# Patient Record
Sex: Female | Born: 1945 | Race: White | Hispanic: No | Marital: Married | Smoking: Never smoker
Health system: Southern US, Community
[De-identification: ages and names within clinical notes are randomized; demographics above are authoritative.]

## PROBLEM LIST (undated history)

## (undated) DIAGNOSIS — R112 Nausea with vomiting, unspecified: Secondary | ICD-10-CM

## (undated) DIAGNOSIS — Z9289 Personal history of other medical treatment: Secondary | ICD-10-CM

## (undated) DIAGNOSIS — Z9889 Other specified postprocedural states: Secondary | ICD-10-CM

## (undated) DIAGNOSIS — C4490 Unspecified malignant neoplasm of skin, unspecified: Secondary | ICD-10-CM

## (undated) DIAGNOSIS — M1990 Unspecified osteoarthritis, unspecified site: Secondary | ICD-10-CM

## (undated) DIAGNOSIS — E7800 Pure hypercholesterolemia, unspecified: Secondary | ICD-10-CM

## (undated) DIAGNOSIS — Z201 Contact with and (suspected) exposure to tuberculosis: Secondary | ICD-10-CM

## (undated) HISTORY — PX: JOINT REPLACEMENT: SHX530

## (undated) HISTORY — PX: TUBAL LIGATION: SHX77

## (undated) HISTORY — PX: SKIN CANCER EXCISION: SHX779

## (undated) HISTORY — PX: BUNIONECTOMY: SHX129

## (undated) HISTORY — PX: CARPAL TUNNEL RELEASE: SHX101

---

## 1998-03-02 ENCOUNTER — Other Ambulatory Visit: Admission: RE | Admit: 1998-03-02 | Discharge: 1998-03-02 | Payer: Self-pay | Admitting: Obstetrics & Gynecology

## 2000-02-23 ENCOUNTER — Other Ambulatory Visit: Admission: RE | Admit: 2000-02-23 | Discharge: 2000-02-23 | Payer: Self-pay | Admitting: Obstetrics and Gynecology

## 2000-09-04 ENCOUNTER — Ambulatory Visit (HOSPITAL_BASED_OUTPATIENT_CLINIC_OR_DEPARTMENT_OTHER): Admission: RE | Admit: 2000-09-04 | Discharge: 2000-09-04 | Payer: Self-pay | Admitting: Orthopedic Surgery

## 2001-04-09 ENCOUNTER — Other Ambulatory Visit: Admission: RE | Admit: 2001-04-09 | Discharge: 2001-04-09 | Payer: Self-pay | Admitting: Obstetrics and Gynecology

## 2001-05-29 ENCOUNTER — Encounter: Admission: RE | Admit: 2001-05-29 | Discharge: 2001-05-29 | Payer: Self-pay | Admitting: Obstetrics and Gynecology

## 2001-05-29 ENCOUNTER — Encounter: Payer: Self-pay | Admitting: Obstetrics and Gynecology

## 2002-09-04 ENCOUNTER — Other Ambulatory Visit: Admission: RE | Admit: 2002-09-04 | Discharge: 2002-09-04 | Payer: Self-pay | Admitting: Obstetrics and Gynecology

## 2003-01-18 ENCOUNTER — Encounter: Admission: RE | Admit: 2003-01-18 | Discharge: 2003-01-18 | Payer: Self-pay | Admitting: Obstetrics and Gynecology

## 2003-02-27 HISTORY — PX: BONE ANCHORED HEARING AID IMPLANT: SHX5193

## 2003-09-06 ENCOUNTER — Other Ambulatory Visit: Admission: RE | Admit: 2003-09-06 | Discharge: 2003-09-06 | Payer: Self-pay | Admitting: Obstetrics and Gynecology

## 2004-10-03 ENCOUNTER — Other Ambulatory Visit: Admission: RE | Admit: 2004-10-03 | Discharge: 2004-10-03 | Payer: Self-pay | Admitting: Obstetrics and Gynecology

## 2005-04-03 ENCOUNTER — Encounter: Admission: RE | Admit: 2005-04-03 | Discharge: 2005-04-03 | Payer: Self-pay | Admitting: Radiology

## 2005-11-07 ENCOUNTER — Other Ambulatory Visit: Admission: RE | Admit: 2005-11-07 | Discharge: 2005-11-07 | Payer: Self-pay | Admitting: Obstetrics and Gynecology

## 2010-06-23 ENCOUNTER — Other Ambulatory Visit (HOSPITAL_COMMUNITY): Payer: Self-pay

## 2011-03-05 ENCOUNTER — Encounter (HOSPITAL_COMMUNITY): Payer: Self-pay

## 2011-03-09 ENCOUNTER — Encounter (HOSPITAL_COMMUNITY): Payer: Self-pay

## 2011-03-09 ENCOUNTER — Encounter (HOSPITAL_COMMUNITY)
Admission: RE | Admit: 2011-03-09 | Discharge: 2011-03-09 | Disposition: A | Discharge status: Home / Self Care | Payer: Medicare Other | Source: Ambulatory Visit | Attending: Orthopedic Surgery | Admitting: Orthopedic Surgery

## 2011-03-09 ENCOUNTER — Other Ambulatory Visit: Payer: Self-pay

## 2011-03-09 HISTORY — DX: Unspecified osteoarthritis, unspecified site: M19.90

## 2011-03-09 HISTORY — DX: Other specified postprocedural states: Z98.890

## 2011-03-09 HISTORY — DX: Unspecified malignant neoplasm of skin, unspecified: C44.90

## 2011-03-09 HISTORY — DX: Nausea with vomiting, unspecified: R11.2

## 2011-03-09 LAB — CBC
MCH: 29.9 pg (ref 26.0–34.0)
MCHC: 33.7 g/dL (ref 30.0–36.0)
MCV: 88.7 fL (ref 78.0–100.0)
Platelets: 240 10*3/uL (ref 150–400)
RBC: 4.85 MIL/uL (ref 3.87–5.11)

## 2011-03-09 LAB — COMPREHENSIVE METABOLIC PANEL
AST: 18 U/L (ref 0–37)
CO2: 31 mEq/L (ref 19–32)
Calcium: 10.1 mg/dL (ref 8.4–10.5)
Creatinine, Ser: 0.77 mg/dL (ref 0.50–1.10)
GFR calc Af Amer: >90 mL/min (ref >90)
GFR calc non Af Amer: 85 mL/min — ABNORMAL LOW (ref >90)

## 2011-03-09 LAB — URINALYSIS, ROUTINE W REFLEX MICROSCOPIC
Glucose, UA: NEGATIVE mg/dL
Hgb urine dipstick: NEGATIVE
Ketones, ur: NEGATIVE mg/dL
Protein, ur: NEGATIVE mg/dL

## 2011-03-09 LAB — SURGICAL PCR SCREEN: MRSA, PCR: NEGATIVE

## 2011-03-09 LAB — TYPE AND SCREEN: ABO/RH(D): B NEG

## 2011-03-09 LAB — PROTIME-INR: INR: 1.03 (ref 0.00–1.49)

## 2011-03-09 LAB — ABO/RH: ABO/RH(D): B NEG

## 2011-03-09 NOTE — Pre-Procedure Instructions (Signed)
20 Lauren Montgomery  03/09/2011   Your procedure is scheduled on:  March 14, 2011  Report to [facility] at 0800 AM.  Call this number if you have problems the morning of surgery: [phone]   Remember:   Do not eat food:After Midnight.  May have clear liquids: up to 4 Hours before arrival.  Clear liquids include soda, tea, black coffee, apple or grape juice, broth.  Take these medicines the morning of surgery with A SIP OF WATER: Tylenol   Do not wear jewelry, make-up or nail polish.  Do not wear lotions, powders, or perfumes. You may wear deodorant.  Do not shave 48 hours prior to surgery.  Do not bring valuables to the hospital.  Contacts, dentures or bridgework may not be worn into surgery.  Leave suitcase in the car. After surgery it may be brought to your room.  For patients admitted to the hospital, checkout time is 11:00 AM the day of discharge.     Special Instructions: Incentive Spirometry - Practice and bring it with you on the day of surgery. and CHG Shower Use Special Wash: 1/2 bottle night before surgery and 1/2 bottle morning of surgery.   Please read over the following fact sheets that you were given: Pain Booklet, Coughing and Deep Breathing, Blood Transfusion Information, Total Joint Packet, MRSA Information and Surgical Site Infection Prevention

## 2011-03-12 NOTE — H&P (Signed)
  [facility] [address]   [address] [place], [place] [address] [phone] A Division of [facility]  Loreta Ave, M.D.     Robert A. Thurston Hole, M.D.     Lunette Stands, M.D. Eulas Post, M.D.    Buford Dresser, M.D. Estell Harpin, M.D. Ralene Cork, D.O.          Genene Churn. Barry Dienes, PA-C            Kirstin A. Shepperson, PA-C Sterling, OPA-C   RE: Lauren, Montgomery                                [id number]      DOB: 08/13/1945 PROGRESS NOTE: 03-06-11 Chief complaint: Left hip pain.  History of present illness: 66 year-old white female with a history of end stage DJD, left hip, and chronic pain.  Returns.  States that symptoms are unchanged from previous visit.  She is wanting to proceed with total hip replacement as scheduled.   Current medications: Per patient, none. Allergies: No known drug allergies. Past medical/surgical history: Tubal ligation, bunionectomy, right carpal tunnel release and skin cancer. Review of systems: Patient does admit to occasional feelings of lightheadedness.  Denies fevers, chills, chest pain, palpitations, shortness of breath, GI or GU issues.     Family history: Positive for diabetes and cancer. Social history: Does not smoke or drink.  Patient is married.    EXAMINATION: Temperature: 98.1.  Pulse: 78.  Respirations: 12.  Blood pressure: 148/83.  Height: 5?2.  Weight: 150 pounds.  Pleasant white female, alert and oriented x 3 and in no acute distress.  Head is normocephalic, a traumatic.  PERRLA, EOMI.  Neck unremarkable.  Lungs: CTA bilaterally.  No wheezes.  Heart: RRR.  S1 and S2.  No murmurs.  Abdomen: Round and non-distended.  NBS x 4.  Soft and non-tender.  Left hip she has about 5 degrees internal and external rotation with marked discomfort.  Neurovascularly intact.  Skin warm and dry.   IMPRESSION: End stage DJD, left hip, and chronic pain.  Failed conservative  treatment.    PLAN:  We will proceed with left total hip replacement as scheduled.  Surgical procedure, along with potential rehab/recovery time discussed.  We will see if we can get her most recent office notes from her primary care physician.    Loreta Ave, M.D.   Electronically verified by Loreta Ave, M.D. DFM(JMO):jjh D 03-06-11 T 03-07-11

## 2011-03-13 MED ORDER — CHLORHEXIDINE GLUCONATE 4 % EX LIQD: 60.0000 mL | Freq: Once | CUTANEOUS | Status: DC

## 2011-03-13 MED ORDER — CEFAZOLIN SODIUM 1-5 GM-% IV SOLN
1.0000 g | INTRAVENOUS | Status: AC
  Administered 2011-03-14: 1 g via INTRAVENOUS
  Filled 2011-03-13: qty 50

## 2011-03-14 ENCOUNTER — Encounter (HOSPITAL_COMMUNITY): Payer: Self-pay | Admitting: Surgery

## 2011-03-14 ENCOUNTER — Encounter (HOSPITAL_COMMUNITY): Payer: Self-pay | Admitting: Anesthesiology

## 2011-03-14 ENCOUNTER — Encounter (HOSPITAL_COMMUNITY): Payer: Self-pay

## 2011-03-14 ENCOUNTER — Encounter (HOSPITAL_COMMUNITY)
Admission: RE | Disposition: A | Discharge status: Home / Self Care | Payer: Self-pay | Source: Ambulatory Visit | Attending: Orthopedic Surgery

## 2011-03-14 ENCOUNTER — Inpatient Hospital Stay (HOSPITAL_COMMUNITY)
Admission: RE | Admit: 2011-03-14 | Discharge: 2011-03-16 | DRG: 470 | Disposition: A | Discharge status: Home / Self Care | Payer: Medicare Other | Source: Ambulatory Visit | Attending: Orthopedic Surgery | Admitting: Orthopedic Surgery

## 2011-03-14 ENCOUNTER — Ambulatory Visit (HOSPITAL_COMMUNITY): Payer: Medicare Other | Admitting: Anesthesiology

## 2011-03-14 DIAGNOSIS — Z01812 Encounter for preprocedural laboratory examination: Secondary | ICD-10-CM

## 2011-03-14 DIAGNOSIS — Z01818 Encounter for other preprocedural examination: Secondary | ICD-10-CM

## 2011-03-14 DIAGNOSIS — M1610 Unilateral primary osteoarthritis, unspecified hip: Principal | ICD-10-CM | POA: Diagnosis present

## 2011-03-14 DIAGNOSIS — Z85828 Personal history of other malignant neoplasm of skin: Secondary | ICD-10-CM

## 2011-03-14 DIAGNOSIS — M169 Osteoarthritis of hip, unspecified: Principal | ICD-10-CM | POA: Diagnosis present

## 2011-03-14 DIAGNOSIS — Z01810 Encounter for preprocedural cardiovascular examination: Secondary | ICD-10-CM

## 2011-03-14 DIAGNOSIS — Z01811 Encounter for preprocedural respiratory examination: Secondary | ICD-10-CM

## 2011-03-14 DIAGNOSIS — Z471 Aftercare following joint replacement surgery: Secondary | ICD-10-CM

## 2011-03-14 HISTORY — PX: TOTAL HIP ARTHROPLASTY: SHX124

## 2011-03-14 SURGERY — ARTHROPLASTY, HIP, TOTAL,POSTERIOR APPROACH
Anesthesia: General | Site: Hip | Laterality: Left | Wound class: Clean

## 2011-03-14 MED ORDER — BUPIVACAINE HCL 0.5 % IJ SOLN
INTRAMUSCULAR | Status: DC | PRN
  Administered 2011-03-14: 10 mL

## 2011-03-14 MED ORDER — ACETAMINOPHEN 650 MG RE SUPP: 650.0000 mg | Freq: Four times a day (QID) | RECTAL | Status: DC | PRN

## 2011-03-14 MED ORDER — WARFARIN VIDEO
Freq: Once | Status: AC
  Administered 2011-03-15: 16:00:00

## 2011-03-14 MED ORDER — ACETAMINOPHEN 10 MG/ML IV SOLN
INTRAVENOUS | Status: DC | PRN
  Administered 2011-03-14: 1000 mg via INTRAVENOUS

## 2011-03-14 MED ORDER — ONDANSETRON HCL 4 MG/2ML IJ SOLN
4.0000 mg | Freq: Four times a day (QID) | INTRAMUSCULAR | Status: DC | PRN
  Administered 2011-03-14 – 2011-03-15 (×2): 4 mg via INTRAVENOUS
  Filled 2011-03-14 (×3): qty 2

## 2011-03-14 MED ORDER — PROPOFOL 10 MG/ML IV EMUL
INTRAVENOUS | Status: DC | PRN
  Administered 2011-03-14: 20 mg via INTRAVENOUS
  Administered 2011-03-14: 30 mg via INTRAVENOUS
  Administered 2011-03-14: 100 mg via INTRAVENOUS

## 2011-03-14 MED ORDER — DOCUSATE SODIUM 100 MG PO CAPS
100.0000 mg | ORAL_CAPSULE | Freq: Two times a day (BID) | ORAL | Status: DC
  Administered 2011-03-14 – 2011-03-16 (×4): 100 mg via ORAL
  Filled 2011-03-14 (×5): qty 1

## 2011-03-14 MED ORDER — SODIUM CHLORIDE 0.9 % IJ SOLN: 9.0000 mL | INTRAMUSCULAR | Status: DC | PRN

## 2011-03-14 MED ORDER — FLEET ENEMA 7-19 GM/118ML RE ENEM: 1.0000 | ENEMA | Freq: Once | RECTAL | Status: AC | PRN

## 2011-03-14 MED ORDER — METHOCARBAMOL 500 MG PO TABS
500.0000 mg | ORAL_TABLET | Freq: Four times a day (QID) | ORAL | Status: DC | PRN
  Filled 2011-03-14: qty 1

## 2011-03-14 MED ORDER — HYDROMORPHONE HCL PF 1 MG/ML IJ SOLN
0.2500 mg | INTRAMUSCULAR | Status: DC | PRN
  Administered 2011-03-14 (×4): 0.5 mg via INTRAVENOUS

## 2011-03-14 MED ORDER — EPHEDRINE SULFATE 50 MG/ML IJ SOLN
INTRAMUSCULAR | Status: DC | PRN
  Administered 2011-03-14: 10 mg via INTRAVENOUS

## 2011-03-14 MED ORDER — FENTANYL CITRATE 0.05 MG/ML IJ SOLN
INTRAMUSCULAR | Status: DC | PRN
  Administered 2011-03-14 (×5): 50 ug via INTRAVENOUS

## 2011-03-14 MED ORDER — ONDANSETRON HCL 4 MG/2ML IJ SOLN: 4.0000 mg | Freq: Four times a day (QID) | INTRAMUSCULAR | Status: DC | PRN

## 2011-03-14 MED ORDER — MEPERIDINE HCL 25 MG/ML IJ SOLN: 6.2500 mg | INTRAMUSCULAR | Status: DC | PRN

## 2011-03-14 MED ORDER — HYDROMORPHONE HCL PF 1 MG/ML IJ SOLN
INTRAMUSCULAR | Status: AC
  Filled 2011-03-14: qty 1

## 2011-03-14 MED ORDER — METHOCARBAMOL 100 MG/ML IJ SOLN
500.0000 mg | Freq: Four times a day (QID) | INTRAVENOUS | Status: DC | PRN
  Administered 2011-03-14: 500 mg via INTRAVENOUS
  Filled 2011-03-14 (×2): qty 5

## 2011-03-14 MED ORDER — ROCURONIUM BROMIDE 100 MG/10ML IV SOLN
INTRAVENOUS | Status: DC | PRN
  Administered 2011-03-14: 50 mg via INTRAVENOUS

## 2011-03-14 MED ORDER — LACTATED RINGERS IV SOLN
INTRAVENOUS | Status: DC | PRN
  Administered 2011-03-14 (×3): via INTRAVENOUS

## 2011-03-14 MED ORDER — SENNOSIDES-DOCUSATE SODIUM 8.6-50 MG PO TABS: 1.0000 | ORAL_TABLET | Freq: Every evening | ORAL | Status: DC | PRN

## 2011-03-14 MED ORDER — NEOSTIGMINE METHYLSULFATE 1 MG/ML IJ SOLN
INTRAMUSCULAR | Status: DC | PRN
  Administered 2011-03-14: 4 mg via INTRAVENOUS

## 2011-03-14 MED ORDER — SODIUM CHLORIDE 0.9 % IR SOLN
Status: DC | PRN
  Administered 2011-03-14: 1000 mL

## 2011-03-14 MED ORDER — ENOXAPARIN SODIUM 40 MG/0.4ML ~~LOC~~ SOLN
40.0000 mg | SUBCUTANEOUS | Status: DC
  Administered 2011-03-14 – 2011-03-15 (×2): 40 mg via SUBCUTANEOUS
  Filled 2011-03-14 (×3): qty 0.4

## 2011-03-14 MED ORDER — MORPHINE SULFATE (PF) 1 MG/ML IV SOLN: INTRAVENOUS | Status: DC

## 2011-03-14 MED ORDER — ACETAMINOPHEN 10 MG/ML IV SOLN
INTRAVENOUS | Status: AC
  Filled 2011-03-14: qty 100

## 2011-03-14 MED ORDER — CEFAZOLIN SODIUM 1-5 GM-% IV SOLN
1.0000 g | Freq: Three times a day (TID) | INTRAVENOUS | Status: AC
  Administered 2011-03-14 – 2011-03-15 (×3): 1 g via INTRAVENOUS
  Filled 2011-03-14 (×3): qty 50

## 2011-03-14 MED ORDER — PROMETHAZINE HCL 25 MG/ML IJ SOLN: 6.2500 mg | INTRAMUSCULAR | Status: DC | PRN

## 2011-03-14 MED ORDER — ACETAMINOPHEN 325 MG PO TABS: 650.0000 mg | ORAL_TABLET | Freq: Four times a day (QID) | ORAL | Status: DC | PRN

## 2011-03-14 MED ORDER — DIPHENHYDRAMINE HCL 50 MG/ML IJ SOLN
12.5000 mg | Freq: Four times a day (QID) | INTRAMUSCULAR | Status: DC | PRN
  Filled 2011-03-14: qty 0.25

## 2011-03-14 MED ORDER — COUMADIN BOOK
Freq: Once | Status: AC
  Administered 2011-03-14: 19:00:00
  Filled 2011-03-14: qty 1

## 2011-03-14 MED ORDER — ONDANSETRON HCL 4 MG/2ML IJ SOLN
INTRAMUSCULAR | Status: DC | PRN
  Administered 2011-03-14: 4 mg via INTRAVENOUS

## 2011-03-14 MED ORDER — MORPHINE SULFATE (PF) 1 MG/ML IV SOLN
INTRAVENOUS | Status: DC
  Administered 2011-03-14: 4.5 mg via INTRAVENOUS
  Administered 2011-03-14: 13:00:00 via INTRAVENOUS
  Administered 2011-03-15: 7.5 mg via INTRAVENOUS

## 2011-03-14 MED ORDER — MENTHOL 3 MG MT LOZG: 1.0000 | LOZENGE | OROMUCOSAL | Status: DC | PRN

## 2011-03-14 MED ORDER — GLYCOPYRROLATE 0.2 MG/ML IJ SOLN
INTRAMUSCULAR | Status: DC | PRN
  Administered 2011-03-14: .6 mg via INTRAVENOUS

## 2011-03-14 MED ORDER — WARFARIN SODIUM 5 MG PO TABS
5.0000 mg | ORAL_TABLET | Freq: Once | ORAL | Status: AC
  Administered 2011-03-14: 5 mg via ORAL
  Filled 2011-03-14: qty 1

## 2011-03-14 MED ORDER — LACTATED RINGERS IV SOLN
INTRAVENOUS | Status: DC
  Administered 2011-03-14: 10:00:00 via INTRAVENOUS

## 2011-03-14 MED ORDER — PHENOL 1.4 % MT LIQD
1.0000 | OROMUCOSAL | Status: DC | PRN
  Filled 2011-03-14: qty 177

## 2011-03-14 MED ORDER — MIDAZOLAM HCL 5 MG/5ML IJ SOLN
INTRAMUSCULAR | Status: DC | PRN
  Administered 2011-03-14: 1 mg via INTRAVENOUS

## 2011-03-14 MED ORDER — DIPHENHYDRAMINE HCL 12.5 MG/5ML PO ELIX
12.5000 mg | ORAL_SOLUTION | Freq: Four times a day (QID) | ORAL | Status: DC | PRN
  Filled 2011-03-14: qty 5

## 2011-03-14 MED ORDER — NALOXONE HCL 0.4 MG/ML IJ SOLN
0.4000 mg | INTRAMUSCULAR | Status: DC | PRN
  Filled 2011-03-14: qty 1

## 2011-03-14 MED ORDER — ONDANSETRON HCL 4 MG PO TABS: 4.0000 mg | ORAL_TABLET | Freq: Four times a day (QID) | ORAL | Status: DC | PRN

## 2011-03-14 SURGICAL SUPPLY — 61 items
BLADE SAW SAG 73X25 THK (BLADE) ×1
BLADE SAW SGTL 73X25 THK (BLADE) ×1 IMPLANT
BOOTCOVER CLEANROOM LRG (PROTECTIVE WEAR) ×4 IMPLANT
BRUSH FEMORAL CANAL (MISCELLANEOUS) IMPLANT
CLOTH BEACON ORANGE TIMEOUT ST (SAFETY) ×2 IMPLANT
COVER BACK TABLE 24X17X13 BIG (DRAPES) IMPLANT
COVER SURGICAL LIGHT HANDLE (MISCELLANEOUS) ×2 IMPLANT
DRAPE INCISE IOBAN 66X45 STRL (DRAPES) IMPLANT
DRAPE ORTHO SPLIT 77X108 STRL (DRAPES) ×4
DRAPE SURG ORHT 6 SPLT 77X108 (DRAPES) ×2 IMPLANT
DRAPE U-SHAPE 47X51 STRL (DRAPES) ×2 IMPLANT
DRILL BIT 7/64X5 (BIT) ×2 IMPLANT
DRSG PAD ABDOMINAL 8X10 ST (GAUZE/BANDAGES/DRESSINGS) ×3 IMPLANT
DURAPREP 26ML APPLICATOR (WOUND CARE) ×2 IMPLANT
ELECT BLADE 6.5 EXT (BLADE) IMPLANT
ELECT CAUTERY BLADE 6.4 (BLADE) ×2 IMPLANT
ELECT REM PT RETURN 9FT ADLT (ELECTROSURGICAL) ×2
ELECTRODE REM PT RTRN 9FT ADLT (ELECTROSURGICAL) ×1 IMPLANT
EVACUATOR 1/8 PVC DRAIN (DRAIN) IMPLANT
FACESHIELD LNG OPTICON STERILE (SAFETY) ×4 IMPLANT
GAUZE SPONGE 4X4 12PLY STRL LF (GAUZE/BANDAGES/DRESSINGS) ×1 IMPLANT
GAUZE XEROFORM 5X9 LF (GAUZE/BANDAGES/DRESSINGS) ×2 IMPLANT
GLOVE BIOGEL PI IND STRL 8 (GLOVE) ×1 IMPLANT
GLOVE BIOGEL PI INDICATOR 8 (GLOVE) ×1
GLOVE ORTHO TXT STRL SZ7.5 (GLOVE) ×4 IMPLANT
GOWN PREVENTION PLUS XLARGE (GOWN DISPOSABLE) ×2 IMPLANT
GOWN PREVENTION PLUS XXLARGE (GOWN DISPOSABLE) ×2 IMPLANT
GOWN STRL NON-REIN LRG LVL3 (GOWN DISPOSABLE) ×2 IMPLANT
HANDPIECE INTERPULSE COAX TIP (DISPOSABLE)
KIT BASIN OR (CUSTOM PROCEDURE TRAY) ×2 IMPLANT
KIT ROOM TURNOVER OR (KITS) ×2 IMPLANT
MANIFOLD NEPTUNE II (INSTRUMENTS) ×2 IMPLANT
NDL HYPO 25GX1X1/2 BEV (NEEDLE) ×1 IMPLANT
NEEDLE HYPO 25GX1X1/2 BEV (NEEDLE) ×2 IMPLANT
NS IRRIG 1000ML POUR BTL (IV SOLUTION) ×2 IMPLANT
PACK TOTAL JOINT (CUSTOM PROCEDURE TRAY) ×2 IMPLANT
PAD ARMBOARD 7.5X6 YLW CONV (MISCELLANEOUS) ×4 IMPLANT
PASSER SUT SWANSON 36MM LOOP (INSTRUMENTS) ×2 IMPLANT
PILLOW ABDUCTION HIP (SOFTGOODS) ×2 IMPLANT
PRESSURIZER FEMORAL UNIV (MISCELLANEOUS) IMPLANT
SET HNDPC FAN SPRY TIP SCT (DISPOSABLE) IMPLANT
SPONGE GAUZE 4X4 12PLY (GAUZE/BANDAGES/DRESSINGS) ×2 IMPLANT
SPONGE LAP 4X18 X RAY DECT (DISPOSABLE) IMPLANT
STAPLER VISISTAT 35W (STAPLE) ×2 IMPLANT
SUCTION FRAZIER TIP 10 FR DISP (SUCTIONS) ×2 IMPLANT
SUT FIBERWIRE #2 38 REV NDL BL (SUTURE) ×6
SUT VIC AB 1 CTX 36 (SUTURE) ×8
SUT VIC AB 1 CTX36XBRD ANBCTR (SUTURE) ×4 IMPLANT
SUT VIC AB 2-0 SH 27 (SUTURE) ×4
SUT VIC AB 2-0 SH 27XBRD (SUTURE) ×2 IMPLANT
SUT VIC AB 3-0 SH 27 (SUTURE)
SUT VIC AB 3-0 SH 27X BRD (SUTURE) IMPLANT
SUTURE FIBERWR#2 38 REV NDL BL (SUTURE) ×3 IMPLANT
SYR 30ML SLIP (SYRINGE) ×2 IMPLANT
SYR CONTROL 10ML LL (SYRINGE) ×2 IMPLANT
TAPE CLOTH SURG 4X10 WHT LF (GAUZE/BANDAGES/DRESSINGS) ×1 IMPLANT
TOWEL OR 17X24 6PK STRL BLUE (TOWEL DISPOSABLE) ×2 IMPLANT
TOWEL OR 17X26 10 PK STRL BLUE (TOWEL DISPOSABLE) ×2 IMPLANT
TOWER CARTRIDGE SMART MIX (DISPOSABLE) IMPLANT
TRAY FOLEY CATH 14FR (SET/KITS/TRAYS/PACK) ×2 IMPLANT
WATER STERILE IRR 1000ML POUR (IV SOLUTION) ×8 IMPLANT

## 2011-03-14 NOTE — Plan of Care (Signed)
Problem: Consults Goal: Diagnosis- Total Joint Replacement hemiarthroplasty

## 2011-03-14 NOTE — Brief Op Note (Signed)
03/14/2011  1:48 PM  PATIENT:  Lauren Montgomery  66 y.o. female  PRE-OPERATIVE DIAGNOSIS:  DJD LEFT HIP  POST-OPERATIVE DIAGNOSIS:  DJD LEFT HIP  PROCEDURE:  Procedure(s): Left TOTAL HIP ARTHROPLASTY  SURGEON:  Surgeon(s): Loreta Ave, MD  PHYSICIAN ASSISTANT: Zonia Kief M   ANESTHESIA:   general  EBL:  Total I/O In: 2100 [I.V.:2100] Out: 1400 [Urine:1200; Blood:200]  BLOOD ADMINISTERED:none  SPECIMEN:  No Specimen  DISPOSITION OF SPECIMEN:  N/A  COUNTS:  YES   PLAN OF CARE: Admit to inpatient

## 2011-03-14 NOTE — Anesthesia Postprocedure Evaluation (Signed)
  Anesthesia Post-op Note  Patient: Lauren Montgomery  Procedure(s) Performed:  TOTAL HIP ARTHROPLASTY - 120 MINUTES FOR THIS SURGERY  Patient Location: PACU  Anesthesia Type: General  Level of Consciousness: awake  Airway and Oxygen Therapy: Patient Spontanous Breathing and Patient connected to nasal cannula oxygen  Post-op Pain: moderate  Post-op Assessment: Post-op Vital signs reviewed, Patient's Cardiovascular Status Stable, Respiratory Function Stable, Patent Airway and No signs of Nausea or vomiting  Post-op Vital Signs: Reviewed and stable  Complications: No apparent anesthesia complications

## 2011-03-14 NOTE — Preoperative (Signed)
Beta Blockers   Reason not to administer Beta Blockers:Not Applicable 

## 2011-03-14 NOTE — Progress Notes (Signed)
ANTICOAGULATION CONSULT NOTE - Initial Consult  Pharmacy Consult for Coumadin Indication: VTE prophylaxis  Allergies  Allergen Reactions  . Codeine Nausea Only    Patient Measurements: Height: 5\' 2"  (157.5 cm) Weight: 152 lb (68.947 kg) IBW/kg (Calculated) : 50.1    Vital Signs: Temp: 97.1 F (36.2 C) (01/16 1356) Temp src: Oral (01/16 0757) BP: 129/65 mmHg (01/16 1356) Pulse Rate: 82  (01/16 1356)  Labs: preop labs 03/09/11  PTT 28, PT 13.7, INR 1.03, H/H 14.5/43,  PLTC= 240K, WBC 8K, SCr 0.77 No results found for this basename: HGB:2,HCT:3,PLT:3,APTT:3,LABPROT:3,INR:3,HEPARINUNFRC:3,CREATININE:3,CKTOTAL:3,CKMB:3,TROPONINI:3 in the last 72 hours Estimated Creatinine Clearance: 62.1 ml/min (by C-G formula based on Cr of 0.77).  Medical History: Past Medical History  Diagnosis Date  . Skin cancer     nose  . Blood transfusion     with childbirth  . Osteoarthritis   . Cataract     left eye  . Degenerative joint disease   . PONV (postoperative nausea and vomiting)     Medications:  Prescriptions prior to admission  Medication Sig Dispense Refill  . Acetaminophen (TYLENOL PO) Take 2 tablets by mouth 2 (two) times daily as needed. For pain.       Bertram Gala Glycol-Propyl Glycol (SYSTANE OP) Place 2 drops into the left eye 2 (two) times daily as needed. For dry eyes       Scheduled:    . ceFAZolin (ANCEF) IV  1 g Intravenous 60 min Pre-Op  . ceFAZolin (ANCEF) IV  1 g Intravenous Q8H  . docusate sodium  100 mg Oral BID  . enoxaparin  40 mg Subcutaneous Q24H  . HYDROmorphone      . morphine   Intravenous Q4H  . morphine   Intravenous To PACU  . DISCONTD: chlorhexidine  60 mL Topical Once    Assessment: 66 yo female s/p left THA due to DJD left hip. Coumadin and Lovenox bridge for VTE prophylaxis.  No h/o bleeding complication reported.  Coumadin point score = 1.  Goal of Therapy:  INR 2-3   Plan:  Coumadin 5mg  po today x1.  Monitor INR daily.  Coumadin  education to be complete prior to discharge.   Arman Filter 03/14/2011,4:35 PM

## 2011-03-14 NOTE — Interval H&P Note (Signed)
History and Physical Interval Note:  03/14/2011 8:34 AM  Lauren Montgomery  has presented today for surgery, with the diagnosis of DJD LEFT HIP  The various methods of treatment have been discussed with the patient and family. After consideration of risks, benefits and other options for treatment, the patient has consented to  Procedure(s): TOTAL HIP ARTHROPLASTY as a surgical intervention .  The patients' history has been reviewed, patient examined, no change in status, stable for surgery.  I have reviewed the patients' chart and labs.  Questions were answered to the patient's satisfaction.     MURPHY,DANIEL F

## 2011-03-14 NOTE — Anesthesia Preprocedure Evaluation (Addendum)
Anesthesia Evaluation  Patient identified by MRN, date of birth, ID band Patient awake    Reviewed: Allergy & Precautions, H&P , NPO status , Patient's Chart, lab work & pertinent test results  History of Anesthesia Complications (+) PONV  Airway Mallampati: III TM Distance: >3 FB Neck ROM: Full    Dental No notable dental hx. (+) Teeth Intact   Pulmonary neg pulmonary ROS,  clear to auscultation  Pulmonary exam normal       Cardiovascular neg cardio ROS Regular Normal    Neuro/Psych Negative Neurological ROS  Negative Psych ROS   GI/Hepatic negative GI ROS, Neg liver ROS,   Endo/Other  Negative Endocrine ROS  Renal/GU negative Renal ROS  Genitourinary negative   Musculoskeletal   Abdominal   Peds  Hematology negative hematology ROS (+)   Anesthesia Other Findings   Reproductive/Obstetrics negative OB ROS                           Anesthesia Physical Anesthesia Plan  ASA: II  Anesthesia Plan: General   Post-op Pain Management:    Induction: Intravenous  Airway Management Planned: Oral ETT  Additional Equipment:   Intra-op Plan:   Post-operative Plan: Extubation in OR  Informed Consent: I have reviewed the patients History and Physical, chart, labs and discussed the procedure including the risks, benefits and alternatives for the proposed anesthesia with the patient or authorized representative who has indicated his/her understanding and acceptance.     Plan Discussed with: CRNA  Anesthesia Plan Comments:         Anesthesia Quick Evaluation

## 2011-03-14 NOTE — Transfer of Care (Signed)
Immediate Anesthesia Transfer of Care Note  Patient: Lauren Montgomery  Procedure(s) Performed:  TOTAL HIP ARTHROPLASTY - 120 MINUTES FOR THIS SURGERY  Patient Location: PACU  Anesthesia Type: General  Level of Consciousness: awake, alert , oriented and sedated  Airway & Oxygen Therapy: Patient Spontanous Breathing and Patient connected to nasal cannula oxygen  Post-op Assessment: Report given to PACU RN, Post -op Vital signs reviewed and stable and Patient moving all extremities  Post vital signs: Reviewed and stable Filed Vitals:   03/14/11 0757  BP: 143/94  Pulse: 63  Temp: 36.6 C  Resp: 20    Complications: No apparent anesthesia complications

## 2011-03-15 ENCOUNTER — Encounter (HOSPITAL_COMMUNITY): Payer: Self-pay | Admitting: Orthopedic Surgery

## 2011-03-15 LAB — CBC
Hemoglobin: 11.8 g/dL — ABNORMAL LOW (ref 12.0–15.0)
MCV: 88.6 fL (ref 78.0–100.0)
Platelets: 187 10*3/uL (ref 150–400)
RBC: 4.04 MIL/uL (ref 3.87–5.11)
WBC: 10 10*3/uL (ref 4.0–10.5)

## 2011-03-15 LAB — BASIC METABOLIC PANEL
BUN: 11 mg/dL (ref 6–23)
CO2: 27 mEq/L (ref 19–32)
Calcium: 8.9 mg/dL (ref 8.4–10.5)
Chloride: 103 mEq/L (ref 96–112)
Creatinine, Ser: 0.78 mg/dL (ref 0.50–1.10)
GFR calc Af Amer: >90 mL/min (ref >90)
GFR calc non Af Amer: 85 mL/min — ABNORMAL LOW (ref >90)
Glucose, Bld: 137 mg/dL — ABNORMAL HIGH (ref 70–99)
Potassium: 4 mEq/L (ref 3.5–5.1)
Sodium: 140 mEq/L (ref 135–145)

## 2011-03-15 MED ORDER — OXYCODONE-ACETAMINOPHEN 5-325 MG PO TABS
1.0000 | ORAL_TABLET | ORAL | Status: DC | PRN
  Administered 2011-03-15 – 2011-03-16 (×5): 1 via ORAL
  Filled 2011-03-15 (×5): qty 1

## 2011-03-15 MED ORDER — WARFARIN SODIUM 5 MG PO TABS
5.0000 mg | ORAL_TABLET | Freq: Once | ORAL | Status: AC
  Administered 2011-03-15: 5 mg via ORAL
  Filled 2011-03-15: qty 1

## 2011-03-15 NOTE — Progress Notes (Signed)
UR COMPLETED  

## 2011-03-15 NOTE — Progress Notes (Signed)
Physical Therapy Evaluation Patient Details Name: Lauren Montgomery MRN: [id number] DOB: 03/20/1945 Today's Date: 03/15/2011  Problem List: There is no problem list on file for this patient.   Past Medical History:  Past Medical History  Diagnosis Date  . Skin cancer     nose  . Blood transfusion     with childbirth  . Osteoarthritis   . Cataract     left eye  . Degenerative joint disease   . PONV (postoperative nausea and vomiting)    Past Surgical History:  Past Surgical History  Procedure Date  . Bone anchored hearing aid implant   . Carpal tunnel release     right hand  . Tubal ligation   . Bunionectomy   . Total hip arthroplasty 03/14/2011    Procedure: TOTAL HIP ARTHROPLASTY;  Surgeon: Loreta Ave, MD;  Location: [facility] OR;  Service: Orthopedics;  Laterality: Left;  120 MINUTES FOR THIS SURGERY    PT Assessment/Plan/Recommendation PT Assessment PT Recommendation/Assessment: Patient will need skilled PT in the acute care venue PT Problem List: Decreased strength;Decreased range of motion;Decreased activity tolerance;Decreased balance;Decreased mobility;Pain Barriers to Discharge: None PT Therapy Diagnosis : Difficulty walking;Abnormality of gait;Generalized weakness;Acute pain PT Plan PT Frequency: 7X/week PT Treatment/Interventions: Gait training;Stair training;Functional mobility training;Therapeutic activities;Therapeutic exercise PT Recommendation Follow Up Recommendations: Home health PT Equipment Recommended:  (has RW and BSC) PT Goals  Acute Rehab PT Goals PT Goal Formulation: With patient Time For Goal Achievement: 7 days Pt will go Supine/Side to Sit: with supervision Pt will go Sit to Stand: with supervision Pt will Ambulate: 51 - 150 feet;with supervision;with rolling walker Pt will Perform Home Exercise Program: Independently  PT Evaluation Precautions/Restrictions  Precautions Precautions: Posterior Hip Precaution Booklet Issued: Yes  (comment) Restrictions Weight Bearing Restrictions: Yes LLE Weight Bearing: Touchdown weight bearing Prior Functioning  Home Living Lives With: Spouse Type of Home: House Home Layout: One level Home Access: Stairs to enter Entrance Stairs-Rails: Right Entrance Stairs-Number of Steps: 3 Bathroom Shower/Tub: Tub/shower unit;Walk-in shower Bathroom Toilet: Standard Bathroom Accessibility: Yes How Accessible: Accessible via walker Home Adaptive Equipment: Walker - rolling;Bedside commode/3-in-1;Straight cane;Reacher Prior Function Level of Independence: Independent with basic ADLs;Independent with gait;Independent with transfers Able to Take Stairs?: Yes Driving: Yes Vocation: Retired Financial risk analyst Arousal/Alertness: Awake/alert Orientation Level: Oriented X4 Sensation/Coordination Sensation Light Touch: Appears Intact Pain: 7/10 Extremity Assessment RUE Assessment RUE Assessment: Within Functional Limits LUE Assessment LUE Assessment: Within Functional Limits RLE Assessment RLE Assessment: Within Functional Limits LLE Assessment LLE Assessment: Not tested (due to pt s/p L THA) Mobility (including Balance) Bed Mobility Bed Mobility: Yes Supine to Sit: 2: Max assist Transfers Transfers: Yes Sit to Stand: 2: Max assist;From bed Stand Pivot Transfers: 2: Max assist Ambulation/Gait Ambulation/Gait: Yes Ambulation/Gait Assistance: 2: Max assist Assistive device: Rolling walker Gait Pattern:  (pivoted on R LE) Gait velocity: increased time required Stairs: No  Balance Balance Assessed: Yes Static Sitting Balance Static Sitting - Balance Support: Feet supported Static Sitting - Level of Assistance: 4: Min Oncologist Standing - Balance Support: Bilateral upper extremity supported Static Standing - Level of Assistance: 3: Mod assist Exercise  Total Joint Exercises Ankle Circles/Pumps: AROM;10 reps;Both;Supine Quad Sets: AROM;Left;10  reps;Supine Gluteal Sets: AROM;10 reps;Supine;Left Hip ABduction/ADduction: AAROM;Left;10 reps;Supine End of Session PT - End of Session Equipment Utilized During Treatment: Gait belt Activity Tolerance: Patient limited by fatigue Patient left: in chair;with call bell in reach Nurse Communication: Mobility status for transfers General Behavior During Session:  WFL for tasks performed Cognition: [facility] for tasks performed  [name] 03/15/2011, 1:19 PM  [name], PT, DPT Pager #: [phone] Office #: [phone]

## 2011-03-15 NOTE — Progress Notes (Signed)
Physical Therapy Treatment Patient Details Name: Lauren Montgomery MRN: [id number] DOB: 1945/09/08 Today's Date: 03/15/2011  PT Assessment/Plan  PT - Assessment/Plan Comments on Treatment Session: Patient with significant increase in ther ex and ambulation tolerance this date. Patient 100% compliant with L LE TTWB. PT Plan: Discharge plan remains appropriate PT Frequency: 7X/week Follow Up Recommendations: Home health PT Equipment Recommended: None recommended by PT PT Goals  Acute Rehab PT Goals PT Goal Formulation: With patient Time For Goal Achievement: 7 days Pt will go Supine/Side to Sit: with supervision PT Goal: Supine/Side to Sit - Progress: Progressing toward goal Pt will go Sit to Stand: with supervision PT Goal: Sit to Stand - Progress: Progressing toward goal Pt will Ambulate: 51 - 150 feet;with supervision;with rolling walker PT Goal: Ambulate - Progress: Progressing toward goal Pt will Perform Home Exercise Program: Independently PT Goal: Perform Home Exercise Program - Progress: Progressing toward goal  PT Treatment Precautions/Restrictions  Precautions Precautions: Posterior Hip Precaution Booklet Issued: Yes (comment) Restrictions Weight Bearing Restrictions: Yes LLE Weight Bearing: Touchdown weight bearing Mobility (including Balance) Bed Mobility Bed Mobility: Yes Supine to Sit: 3: Mod assist Supine to Sit Details (indicate cue type and reason): assist for L LE managment Transfers Transfers: Yes Sit to Stand: 3: Mod assist Sit to Stand Details (indicate cue type and reason): verbal cues for hand placement Stand Pivot Transfers: 2: Max assist Ambulation/Gait Ambulation/Gait: Yes Ambulation/Gait Assistance: 4: Min assist Ambulation/Gait Assistance Details (indicate cue type and reason): patient 100% compliant with L LE TWB Ambulation Distance (Feet): 50 Feet Assistive device: Rolling walker Gait Pattern:  (hopped on R LE) Gait velocity: decreased  cadence Stairs: No  Balance Balance Assessed: Yes Static Sitting Balance Static Sitting - Balance Support: Feet supported Static Sitting - Level of Assistance: 4: Min assist Static Standing Balance Static Standing - Balance Support: Bilateral upper extremity supported Static Standing - Level of Assistance: 3: Mod assist Exercise  Total Joint Exercises Ankle Circles/Pumps: AROM;Both;10 reps;Supine Quad Sets: AROM;Left;10 reps;Supine Gluteal Sets: AROM;Both;10 reps;Supine Hip ABduction/ADduction: AAROM;Left;10 reps;Supine End of Session PT - End of Session Equipment Utilized During Treatment: Gait belt Activity Tolerance: Patient tolerated treatment well Patient left: in chair;with call bell in reach Nurse Communication: Mobility status for transfers General Behavior During Session: [facility] for tasks performed Cognition: [facility] for tasks performed  [name] 03/15/2011, 3:21 PM  [name], PT, DPT Pager #: [phone] Office #: [phone]

## 2011-03-15 NOTE — Progress Notes (Signed)
Subjective: Doing well   Pain controlled  Objective: Vital signs in last 24 hours: Temp:  [96.8 F (36 C)-98.9 F (37.2 C)] 98.9 F (37.2 C) (01/17 0635) Pulse Rate:  [80-97] 93  (01/17 0635) Resp:  [13-19] 18  (01/17 0635) BP: (117-139)/(64-83) 124/71 mmHg (01/17 0635) SpO2:  [94 %-100 %] 100 % (01/17 0635) Weight:  [68.947 kg (152 lb)] 68.947 kg (152 lb) (01/16 1356)  Intake/Output from previous day: 01/16 0701 - 01/17 0700 In: 3075 [P.O.:360; I.V.:2715] Out: 2600 [Urine:2400; Blood:200] Intake/Output this shift:    No results found for this basename: HGB:5 in the last 72 hours No results found for this basename: WBC:2,RBC:2,HCT:2,PLT:2 in the last 72 hours No results found for this basename: NA:2,K:2,CL:2,CO2:2,BUN:2,CREATININE:2,GLUCOSE:2,CALCIUM:2 in the last 72 hours  Basename 03/15/11 0610  LABPT --  INR 1.13    Neurovascular intact Dressing c/d/i.  Calf nontender. Assessment/Plan: D/c pca.  PT consult.  Anticipate d/c home fri or sat.     Taevin Mcferran M 03/15/2011, 9:50 AM

## 2011-03-15 NOTE — Op Note (Signed)
Lauren Montgomery, Lauren Montgomery NO.:  [id number]  MEDICAL RECORD NO.:  [id number]  LOCATION:  5013                         FACILITY:  [facility]  PHYSICIAN:  Loreta Ave, M.D. DATE OF BIRTH:  11/13/1945  DATE OF PROCEDURE:  03/14/2011 DATE OF DISCHARGE:                              OPERATIVE REPORT   PREOPERATIVE DIAGNOSIS:  End-stage degenerative arthritis, left hip. Marked erosion and spur formation.  POSTOPERATIVE DIAGNOSIS:  End-stage degenerative arthritis, left hip. Marked erosion and spur formation.  PROCEDURE:  Left total hip replacement utilizing a Stryker secure fit prosthesis.  Press-fit 46-mm acetabular component.  Screw fixation x2. A 32-mm internal diameter polyethylene liner 10-degree overhang placed posterosuperior.  Press-fit #5 secure Secur-Fit stem.  132-degree neck angle.  A 32-mm +2.5 Biolox head.  SURGEON:  Loreta Ave, MD  ASSISTANT:  Genene Churn. Barry Dienes, [place], present throughout the entire case and necessary for timely completion of procedure.  ANESTHESIA:  General.  BLOOD LOSS:  200 mL.  BLOOD GIVEN:  None.  SPECIMENS:  None.  CULTURES:  None.  COMPLICATIONS:  None.  DRESSINGS:  Soft compressive abduction pillow.  PROCEDURE:  The patient was brought to the operating room and placed on the operating table in supine position.  After adequate anesthesia had been obtained, turned to a lateral position, left side up, appropriate padding and support.  Prepped and draped in usual sterile fashion. Incision along the shaft of the femur extending posterosuperior.  Skin and subcutaneous tissue divided.  Hemostasis with cautery.  Iliotibial band exposed, incised, Charnley retractor put in place.  Neurovascular structures identified, protected, and retracted.  External rotator capsule taken down off the back of the intertrochanteric groove, tagged with FiberWire.  Hip dislocated posteriorly.  Grade 4 changes.  Femoral neck cut 1 fingerbreadth  above the lesser trochanter.  Acetabulum exposed.  Surrounding spurs removed.  Brought up to good bleeding bone, sufficient inferior medial placement.  Fitted with a 46-mm cup, hammered in place at 45 degrees of abduction, 20 degrees of anteversion. Excellent capturing and fixation, augmented with 2 screws.  Predrilled, placed through the cup.  The 32- mm internal diameter polyethylene liner inserted, 10-degree overhang posterosuperior.  Attention turned to the femur.  Proximal distal reaming brought up to good sizing fitting for a #5 component.  After appropriate trials, a #5 components seated.  The 32- mm +2.5 Biolox head attached.  Hip reduced.  Excellent leg lengths; good stability, flexion, and extension.  Wound irrigated.  External rotator capsule repaired to the back of the intertrochanteric groove through drill holes, FiberWire tied over bony bridge.  Wound irrigated. Iliotibial band closed with #1 Vicryl, skin and subcutaneous tissue with Vicryl staples.  Sterile compressive dressing applied.  Returned to supine position.  Abduction pillow placed.  Anesthesia reversed. Brought to recovery room.  Tolerated surgery well.  No complications.     Loreta Ave, M.D.     DFM/MEDQ  D:  03/14/2011  T:  03/15/2011  Job:  [id number]

## 2011-03-15 NOTE — Progress Notes (Signed)
Occupational Therapy Evaluation Patient Details Name: Lauren Montgomery MRN: [id number] DOB: 16-May-1945 Today's Date: 03/15/2011  Problem List: There is no problem list on file for this patient.   Past Medical History:  Past Medical History  Diagnosis Date  . Skin cancer     nose  . Blood transfusion     with childbirth  . Osteoarthritis   . Cataract     left eye  . Degenerative joint disease   . PONV (postoperative nausea and vomiting)    Past Surgical History:  Past Surgical History  Procedure Date  . Bone anchored hearing aid implant   . Carpal tunnel release     right hand  . Tubal ligation   . Bunionectomy   . Total hip arthroplasty 03/14/2011    Procedure: TOTAL HIP ARTHROPLASTY;  Surgeon: Loreta Ave, MD;  Location: [facility] OR;  Service: Orthopedics;  Laterality: Left;  120 MINUTES FOR THIS SURGERY    OT Assessment/Plan/Recommendation OT Assessment Clinical Impression Statement: Pt. presents s/p Left TOTAL HIP ARTHROPLASTY with increased pain. Pt. will benefit from acute OT to increase functional independence with ADLs to supervision level at D/C OT Recommendation/Assessment: Patient will need skilled OT in the acute care venue OT Problem List: Decreased strength;Decreased activity tolerance;Impaired balance (sitting and/or standing);Decreased safety awareness;Decreased knowledge of use of DME or AE;Decreased knowledge of precautions;Pain Barriers to Discharge: None OT Therapy Diagnosis : Acute pain OT Plan OT Frequency: Min 2X/week OT Treatment/Interventions: Self-care/ADL training;DME and/or AE instruction;Energy conservation;Therapeutic activities;Patient/family education;Balance training OT Recommendation Follow Up Recommendations: Home health OT;Supervision - Intermittent Equipment Recommended: None recommended by OT Individuals Consulted Consulted and Agree with Results and Recommendations: Patient OT Goals Acute Rehab OT Goals OT Goal Formulation: With  patient Time For Goal Achievement: 7 days ADL Goals Pt Will Perform Grooming: with set-up;with supervision;Standing at sink ADL Goal: Grooming - Progress: Goal set today Pt Will Perform Lower Body Bathing: with set-up;with supervision;Sit to stand from bed ADL Goal: Lower Body Bathing - Progress: Goal set today Pt Will Perform Lower Body Dressing: with supervision;with set-up;Sit to stand from bed;with adaptive equipment ADL Goal: Lower Body Dressing - Progress: Goal set today Pt Will Transfer to Toilet: with supervision;Ambulation;3-in-1;with DME ADL Goal: Toilet Transfer - Progress: Goal set today Pt Will Perform Toileting - Hygiene: with set-up;Sit to stand from 3-in-1/toilet ADL Goal: Toileting - Hygiene - Progress: Goal set today  OT Evaluation Precautions/Restrictions  Precautions Precautions: Posterior Hip Restrictions Weight Bearing Restrictions: Yes LLE Weight Bearing: Touchdown weight bearing Prior Functioning Home Living Lives With: Spouse Type of Home: House Home Layout: One level Home Access: Stairs to enter Entrance Stairs-Rails: Right Entrance Stairs-Number of Steps: 3 Bathroom Shower/Tub: Tub/shower unit;Walk-in shower Bathroom Toilet: Standard Bathroom Accessibility: Yes How Accessible: Accessible via walker Home Adaptive Equipment: Walker - rolling;Bedside commode/3-in-1;Straight cane;Reacher Prior Function Level of Independence: Independent with basic ADLs;Independent with gait;Independent with transfers Able to Take Stairs?: Yes Driving: Yes Vocation: Retired ADL ADL Eating/Feeding: Performed;Independent Where Assessed - Eating/Feeding: Chair Grooming: Performed;Wash/dry face;Set up Where Assessed - Grooming: Sitting, bed Upper Body Bathing: Simulated;Chest;Left arm;Right arm;Abdomen;Set up Where Assessed - Upper Body Bathing: Sitting, bed Lower Body Bathing: Simulated;Maximal assistance Where Assessed - Lower Body Bathing: Sit to stand from  bed Upper Body Dressing Details (indicate cue type and reason): with donning gown Where Assessed - Upper Body Dressing: Sitting, chair Lower Body Dressing: Simulated;Maximal assistance Where Assessed - Lower Body Dressing: Sit to stand from chair Toilet Transfer: Simulated;Moderate assistance Toilet Transfer Method: Stand  pivot Acupuncturist: Other (comment) Nurse, children's) Toileting - Clothing Manipulation: Simulated;Minimal assistance Toileting - Clothing Manipulation Details (indicate cue type and reason): with moving gown Where Assessed - Glass blower/designer Manipulation: Standing Toileting - Hygiene: Simulated;Minimal assistance Where Assessed - Toileting Hygiene: Standing Tub/Shower Transfer: Not assessed Equipment Used: Rolling walker ADL Comments: Pt. educated on LB ADL techniques to increase safety with maintaining hip precautions. Extremity Assessment RUE Assessment RUE Assessment: Within Functional Limits LUE Assessment LUE Assessment: Within Functional Limits Mobility  Bed Mobility Bed Mobility: Yes Supine to Sit: 1: +2 Total assist;Patient percentage (comment);HOB flat;With rails (pt=40%) Transfers Transfers: Yes Sit to Stand: 1: +2 Total assist;Patient percentage (comment);From elevated surface;From bed (pt=80%)    End of Session OT - End of Session Equipment Utilized During Treatment: Gait belt Activity Tolerance: Patient tolerated treatment well Patient left: in bed Nurse Communication: Mobility status for transfers General Behavior During Session: [facility] for tasks performed Cognition: [facility] for tasks performed   Nayson Traweek, OTR/L Pager [phone] 03/15/2011, 1:02 PM

## 2011-03-15 NOTE — Progress Notes (Addendum)
ANTICOAGULATION CONSULT NOTE - Follow Up Consult  Pharmacy Consult for coumadin  Indication: VTE prophylaxis  Allergies  Allergen Reactions  . Codeine Nausea Only    Patient Measurements: Height: 5\' 2"  (157.5 cm) Weight: 152 lb (68.947 kg) IBW/kg (Calculated) : 50.1   Vital Signs: Temp: 98.9 F (37.2 C) (01/17 0635) BP: 124/71 mmHg (01/17 0635) Pulse Rate: 93  (01/17 0635)  Labs: Baseline 03/09/11 INR 1.03 H/H 14.5/43 Platelets 240k  SCr 0.77  Basename 03/15/11 0610  HGB --  HCT --  PLT --  APTT --  LABPROT 14.7  INR 1.13  HEPARINUNFRC --  CREATININE --  CKTOTAL --  CKMB --  TROPONINI --   Estimated Creatinine Clearance: 62.1 ml/min (by C-G formula based on Cr of 0.77).   Medications:  Scheduled:    .  ceFAZolin (ANCEF) IV  1 g Intravenous Q8H  . coumadin book   Does not apply Once  . docusate sodium  100 mg Oral BID  . enoxaparin  40 mg Subcutaneous Q24H  . HYDROmorphone      . warfarin  5 mg Oral ONCE-1800  . warfarin   Does not apply Once  . DISCONTD: chlorhexidine  60 mL Topical Once  . DISCONTD: morphine   Intravenous Q4H  . DISCONTD: morphine   Intravenous To PACU    Assessment: 66 yr old F post-op day #1 s/p L THA.  No bleeding noted per ortho note.  Coumadin for DVT prophylaxis.  Patient also on enoxaparin 40 mg subcutaneously q24hr until INR>=1.8 per MD.  INR today subtherapeutic at 1.13 after first dose of coumadin.  Baseline H/H, INR, and Scr wnl.     Goal of Therapy:  INR 2-3   Plan:  1.  Coumadin 5 mg po x1 today 2.  F/u daily INR in am 3.  Enoxaparin until INR >=1.8 per MD  4.  Patient was provided coumadin education today, pt stated understanding, and patient's questions/concerns were answered  Darreld Mclean 03/15/2011,12:04 PM

## 2011-03-16 ENCOUNTER — Inpatient Hospital Stay (HOSPITAL_COMMUNITY): Payer: Medicare Other

## 2011-03-16 LAB — CBC
Hemoglobin: 10 g/dL — ABNORMAL LOW (ref 12.0–15.0)
RBC: 3.47 MIL/uL — ABNORMAL LOW (ref 3.87–5.11)

## 2011-03-16 LAB — BASIC METABOLIC PANEL
CO2: 28 mEq/L (ref 19–32)
Glucose, Bld: 117 mg/dL — ABNORMAL HIGH (ref 70–99)
Potassium: 3.7 mEq/L (ref 3.5–5.1)
Sodium: 141 mEq/L (ref 135–145)

## 2011-03-16 LAB — PROTIME-INR
INR: 2.53 — ABNORMAL HIGH (ref 0.00–1.49)
Prothrombin Time: 27.7 seconds — ABNORMAL HIGH (ref 11.6–15.2)

## 2011-03-16 NOTE — Progress Notes (Signed)
ANTICOAGULATION CONSULT NOTE - Follow Up Consult  Pharmacy Consult for coumadin  Indication: VTE prophylaxis  Allergies  Allergen Reactions  . Codeine Nausea Only    Patient Measurements: Height: 5\' 2"  (157.5 cm) Weight: 152 lb (68.947 kg) IBW/kg (Calculated) : 50.1   Vital Signs: Temp: 98.5 F (36.9 C) (01/18 0635) BP: 128/63 mmHg (01/18 0635) Pulse Rate: 100  (01/18 0635)  Labs: Baseline 03/09/11 INR 1.03 H/H 14.5/43 Platelets 240k  SCr 0.77  Basename 03/16/11 0505 03/15/11 1200 03/15/11 0610  HGB 10.0* 11.8* --  HCT 30.9* 35.8* --  PLT 170 187 --  APTT -- -- --  LABPROT 27.7* -- 14.7  INR 2.53* -- 1.13  HEPARINUNFRC -- -- --  CREATININE 0.75 0.78 --  CKTOTAL -- -- --  CKMB -- -- --  TROPONINI -- -- --   Estimated Creatinine Clearance: 62.1 ml/min (by C-G formula based on Cr of 0.75).   Medications:  Scheduled:     . docusate sodium  100 mg Oral BID  . warfarin  5 mg Oral ONCE-1800  . warfarin   Does not apply Once  . DISCONTD: enoxaparin  40 mg Subcutaneous Q24H    Assessment: 66 yr old F post-op day #2 s/p L THA.  No bleeding noted per ortho note 03/15/11.  Coumadin for DVT prophylaxis.  Patient also on enoxaparin 40 mg subcutaneously q24hr until INR>=1.8 per MD.  INR today is 2.53 after two doses of coumadin.  Patient appears to be sensitive to coumadin.  Baseline H/H, INR, and Scr wnl.  H/H and platelets decreased from baseline, likely due to surgery.     Goal of Therapy:  INR 2-3   Plan:  1.  No coumadin today due to rapid increase in INR  2.  F/u daily INR in am 3.  D/c Enoxaparin as INR >=1.8 per MD orders   Helayne Metsker, Maryagnes Amos 03/16/2011,12:13 PM

## 2011-03-16 NOTE — Progress Notes (Signed)
Subjective: Doing well.  Progressing great with therapy.  Objective: Vital signs in last 24 hours: Temp:  [98.5 F (36.9 C)-99.8 F (37.7 C)] 98.5 F (36.9 C) (01/18 0635) Pulse Rate:  [92-100] 100  (01/18 0635) Resp:  [16-18] 18  (01/18 0635) BP: (109-128)/(61-67) 128/63 mmHg (01/18 0635) SpO2:  [95 %-100 %] 97 % (01/18 0635)  Intake/Output from previous day: 01/17 0701 - 01/18 0700 In: 1980 [P.O.:1380; I.V.:600] Out: 3300 [Urine:3300] Intake/Output this shift:     Basename 03/16/11 0505 03/15/11 1200  HGB 10.0* 11.8*    Basename 03/16/11 0505 03/15/11 1200  WBC 8.6 10.0  RBC 3.47* 4.04  HCT 30.9* 35.8*  PLT 170 187    Basename 03/16/11 0505 03/15/11 1200  NA 141 140  K 3.7 4.0  CL 106 103  CO2 28 27  BUN 10 11  CREATININE 0.75 0.78  GLUCOSE 117* 137*  CALCIUM 8.5 8.9    Basename 03/16/11 0505 03/15/11 0610  LABPT -- --  INR 2.53* 1.13    Neurovascular intact Wound looks good.  Staples intact.  No drainage or signs of infection.  Calf nontender.  Assessment/Plan: Anticipate d/c home today if continues to make good progress.   Lauren Montgomery M 03/16/2011, 1:14 PM

## 2011-03-16 NOTE — Discharge Summary (Signed)
   ABBREVIATED DISCHARGE SUMMARY  DATE OF HOSPITALIZATION:  14 Mar 2011    REASON FOR HOSPITALIZATION:   65 YO WF WITH HISTORY END STAGE DJD LEFT HIP AND CHRONIC PAIN.  FAILED CONSERVATIVE TREATMENT.    SIGNIFICANT FINDINGS:  DJD OPERATION:  LEFT THR  FINAL DIAGNOSIS:  SAME   SECONDARY DIAGNOSIS:  NONE   CONSULTANTS:   NONE  DISCHARGE CONDITION:  STABLE  DISCHARGED TO:  HOME

## 2011-03-16 NOTE — Progress Notes (Signed)
Occupational Therapy Treatment Patient Details Name: Lauren Montgomery MRN: [id number] DOB: 1945-05-11 Today's Date: 03/16/2011  OT Assessment/Plan OT Assessment/Plan Comments on Treatment Session: Pt. moving well and anticipates D/C. All education completed for LB ADLs and shower transfer OT Plan: Discharge plan remains appropriate OT Frequency: Min 2X/week Follow Up Recommendations: Home health OT;Supervision - Intermittent Equipment Recommended: None recommended by OT OT Goals Acute Rehab OT Goals OT Goal Formulation: With patient Time For Goal Achievement: 7 days ADL Goals Pt Will Perform Grooming: with set-up;with supervision;Standing at sink ADL Goal: Grooming - Progress: Progressing toward goals Pt Will Perform Lower Body Bathing: with set-up;with supervision;Sit to stand from bed ADL Goal: Lower Body Bathing - Progress: Progressing toward goals Pt Will Perform Lower Body Dressing: with supervision;with set-up;Sit to stand from bed;with adaptive equipment ADL Goal: Lower Body Dressing - Progress: Met Pt Will Transfer to Toilet: with supervision;Ambulation;3-in-1;with DME ADL Goal: Toilet Transfer - Progress: Progressing toward goals Pt Will Perform Toileting - Hygiene: with set-up;Sit to stand from 3-in-1/toilet ADL Goal: Toileting - Hygiene - Progress: Progressing toward goals  OT Treatment Precautions/Restrictions  Precautions Precautions: Posterior Hip Precaution Booklet Issued: Yes (comment) Restrictions Weight Bearing Restrictions: Yes LLE Weight Bearing: Touchdown weight bearing   ADL ADL Lower Body Bathing: Simulated;Set up Lower Body Bathing Details (indicate cue type and reason): With long handled sponge (daughter will get) Where Assessed - Lower Body Bathing: Sit to stand from chair Lower Body Dressing: Performed;Set up Lower Body Dressing Details (indicate cue type and reason): With donning rt. sock with use of reacher and sock aid and educated on AE for all  LB dressing Where Assessed - Lower Body Dressing: Sit to stand from chair Tub/Shower Transfer: Simulated;Minimal assistance Tub/Shower Transfer Details (indicate cue type and reason): Mod verbal cues for use of RW with transfer and use of left LE stepping in first to increase pt. safety and for maintaining hip precautions. Tub/Shower Transfer Method: Ecologist with back Equipment Used: Rolling walker ADL Comments: Pt. educated on LB ADL techniques to increase safety with maintaining hip precautions.    End of Session OT - End of Session Equipment Utilized During Treatment: Gait belt Activity Tolerance: Patient tolerated treatment well Patient left: in chair Nurse Communication: Mobility status for transfers;Weight bearing status General Behavior During Session: [facility] for tasks performed Cognition: [facility] for tasks performed  Cassandria Anger, OTR/L Pager [phone]  03/16/2011, 12:39 PM

## 2011-03-16 NOTE — Progress Notes (Signed)
Physical Therapy Treatment Patient Details Name: Lauren Montgomery MRN: [id number] DOB: December 14, 1945 Today's Date: 03/16/2011  PT Assessment/Plan  PT - Assessment/Plan Comments on Treatment Session: Patient demonstrated the ability to safely negotiate stairs to enter home. Patient with c/o dizziness this AM but was able to tolerate ambulation and stair negotiation PT Plan: Discharge plan remains appropriate PT Frequency: 7X/week Follow Up Recommendations: Home health PT PT Goals  Acute Rehab PT Goals PT Goal: Supine/Side to Sit - Progress: Progressing toward goal PT Goal: Sit to Stand - Progress: Progressing toward goal PT Goal: Ambulate - Progress: Met PT Goal: Perform Home Exercise Program - Progress: Progressing toward goal  PT Treatment Precautions/Restrictions  Precautions Precautions: Posterior Hip Precaution Booklet Issued: Yes (comment) Restrictions Weight Bearing Restrictions: Yes LLE Weight Bearing: Touchdown weight bearing Mobility (including Balance) Bed Mobility Supine to Sit: 3: Mod assist Supine to Sit Details (indicate cue type and reason): assist for L LE managment and trunk assist secondary to no using bed rails to mimic home set up Transfers Sit to Stand: 4: Min assist Sit to Stand Details (indicate cue type and reason): verbal cues for hand placement Ambulation/Gait Ambulation/Gait Assistance: 4: Min assist Ambulation/Gait Assistance Details (indicate cue type and reason): patient encouraged to place <30% WBing through L LE however patient prefers to remain NWB on L LE Ambulation Distance (Feet): 75 Feet Assistive device: Rolling walker Gait Pattern: Decreased step length - left;Decreased stance time - left;Antalgic Gait velocity: decreased cadence Stairs: Yes Stairs Assistance: 4: Min assist Stair Management Technique: One rail Right (hand held assist on L to mimic home set up) Number of Stairs: 3  Height of Stairs: 8     Exercise  Total Joint  Exercises Ankle Circles/Pumps: AROM;Both;10 reps Quad Sets: AROM;Both;10 reps;Supine Gluteal Sets: AROM;Both;10 reps;Supine Hip ABduction/ADduction: AAROM;Left;10 reps;Supine End of Session PT - End of Session Equipment Utilized During Treatment: Gait belt Activity Tolerance: Patient tolerated treatment well Patient left: in chair;with call bell in reach General Behavior During Session: [facility] for tasks performed Cognition: [facility] for tasks performed  [name] 03/16/2011, 11:55 AM  [name], PT, DPT Pager #: [phone] Office #: [phone]

## 2013-08-13 ENCOUNTER — Other Ambulatory Visit: Payer: Self-pay | Admitting: Physician Assistant

## 2013-08-13 NOTE — H&P (Signed)
TOTAL HIP ADMISSION H&P  Patient is admitted for right total hip arthroplasty.  Subjective:  Chief Complaint: right hip pain  HPI: Lauren Montgomery, 68 y.o. female, has a history of pain and functional disability in the right hip(s) due to arthritis and patient has failed non-surgical conservative treatments for greater than 12 weeks to include NSAID's and/or analgesics and activity modification.  Onset of symptoms was gradual starting 1 years ago with gradually worsening course since that time.The patient noted no past surgery on the right hip(s).  Patient currently rates pain in the right hip at 5 out of 10 with activity. Patient has trendelenberg gait. Patient has evidence of subchondral sclerosis and joint space narrowing by imaging studies. This condition presents safety issues increasing the risk of falls.  There is no current active infection.  There are no active problems to display for this patient.  Past Medical History  Diagnosis Date  . Skin cancer     nose  . Blood transfusion     with childbirth  . Osteoarthritis   . Cataract     left eye  . Degenerative joint disease   . PONV (postoperative nausea and vomiting)     Past Surgical History  Procedure Laterality Date  . Bone anchored hearing aid implant    . Carpal tunnel release      right hand  . Tubal ligation    . Bunionectomy    . Total hip arthroplasty  03/14/2011    Procedure: TOTAL HIP ARTHROPLASTY;  Surgeon: [name], MD;  Location: [place];  Service: Orthopedics;  Laterality: Left;  120 MINUTES FOR THIS SURGERY     (Not in a hospital admission) Allergies  Allergen Reactions  . Codeine Nausea Only    History  Substance Use Topics  . Smoking status: Never Smoker   . Smokeless tobacco: Never Used  . Alcohol Use: No    Family History  Problem Relation Age of Onset  . Adopted: Yes     Review of Systems  Constitutional: Negative.   HENT: Positive for hearing loss. Negative for tinnitus.   Eyes:  Negative.   Respiratory: Negative.   Cardiovascular: Negative.   Gastrointestinal: Negative.   Genitourinary: Negative.   Musculoskeletal: Positive for joint pain.  Skin: Negative.   Neurological: Negative.  Negative for headaches.  Endo/Heme/Allergies: Negative.   Psychiatric/Behavioral: Negative.     Objective:  Physical Exam  Constitutional: She is oriented to person, place, and time. She appears well-developed and well-nourished.  HENT:  Head: Normocephalic and atraumatic.  Eyes: EOM are normal. Pupils are equal, round, and reactive to light.  Neck: Normal range of motion. Neck supple.  Cardiovascular: Normal rate and regular rhythm.  Exam reveals no gallop and no friction rub.   No murmur heard. Respiratory: Effort normal and breath sounds normal. No respiratory distress. She has no wheezes. She has no rales.  GI: Soft. Bowel sounds are normal.  Musculoskeletal:  antalgic gait with a significant Trendelenburg component on the right.  Left hip has a negative log roll and good motion.  Both knees have motion from 5-100.  Tibiofemoral and patellofemoral crepitus.  5 degrees of varus.  Neurovascularly intact distally.  Her right hip has a markedly positive log roll.  Essentially no internal rotation.  Limited flexion and abduction.    Neurological: She is alert and oriented to person, place, and time.  Skin: Skin is warm and dry.  Psychiatric: She has a normal mood and affect. Her  behavior is normal. Judgment and thought content normal.    Vital signs in last 24 hours: @VSRANGES @  Labs:   Estimated body mass index is 28.18 kg/(m^2) as calculated from the following:   Height as of 03/14/11: 5\' 2"  (1.575 m).   Weight as of 03/09/11: 69.899 kg (154 lb 1.6 oz).   Imaging Review Plain radiographs demonstrate severe degenerative joint disease of the right hip(s). The bone quality appears to be fair for age and reported activity level.  Assessment/Plan:  End stage arthritis,  right hip(s)  The patient history, physical examination, clinical judgement of the provider and imaging studies are consistent with end stage degenerative joint disease of the right hip(s) and total hip arthroplasty is deemed medically necessary. The treatment options including medical management, injection therapy, arthroscopy and arthroplasty were discussed at length. The risks and benefits of total hip arthroplasty were presented and reviewed. The risks due to aseptic loosening, infection, stiffness, dislocation/subluxation,  thromboembolic complications and other imponderables were discussed.  The patient acknowledged the explanation, agreed to proceed with the plan and consent was signed. Patient is being admitted for inpatient treatment for surgery, pain control, PT, OT, prophylactic antibiotics, VTE prophylaxis, progressive ambulation and ADL's and discharge planning.The patient is planning to be discharged home with home health services

## 2013-08-24 ENCOUNTER — Ambulatory Visit (HOSPITAL_COMMUNITY)
Admission: RE | Admit: 2013-08-24 | Discharge: 2013-08-24 | Disposition: A | Discharge status: Home / Self Care | Payer: Medicare Other | Source: Ambulatory Visit | Attending: Physician Assistant | Admitting: Physician Assistant

## 2013-08-24 ENCOUNTER — Encounter (HOSPITAL_COMMUNITY): Payer: Self-pay

## 2013-08-24 ENCOUNTER — Encounter (HOSPITAL_COMMUNITY)
Admission: RE | Admit: 2013-08-24 | Discharge: 2013-08-24 | Disposition: A | Discharge status: Home / Self Care | Payer: Medicare Other | Source: Ambulatory Visit | Attending: Orthopedic Surgery | Admitting: Orthopedic Surgery

## 2013-08-24 DIAGNOSIS — Z01818 Encounter for other preprocedural examination: Secondary | ICD-10-CM | POA: Insufficient documentation

## 2013-08-24 LAB — TYPE AND SCREEN
ABO/RH(D): B NEG
Antibody Screen: NEGATIVE

## 2013-08-24 LAB — CBC WITH DIFFERENTIAL/PLATELET
BASOS ABS: 0.1 10*3/uL (ref 0.0–0.1)
BASOS PCT: 1 % (ref 0–1)
Eosinophils Absolute: 0.1 10*3/uL (ref 0.0–0.7)
Eosinophils Relative: 2 % (ref 0–5)
HCT: 40.6 % (ref 36.0–46.0)
HEMOGLOBIN: 13.3 g/dL (ref 12.0–15.0)
LYMPHS PCT: 31 % (ref 12–46)
Lymphs Abs: 1.8 10*3/uL (ref 0.7–4.0)
MCH: 30.2 pg (ref 26.0–34.0)
MCHC: 32.8 g/dL (ref 30.0–36.0)
MCV: 92.1 fL (ref 78.0–100.0)
MONO ABS: 0.7 10*3/uL (ref 0.1–1.0)
MONOS PCT: 12 % (ref 3–12)
NEUTROS ABS: 3 10*3/uL (ref 1.7–7.7)
NEUTROS PCT: 54 % (ref 43–77)
Platelets: 211 10*3/uL (ref 150–400)
RBC: 4.41 MIL/uL (ref 3.87–5.11)
RDW: 13.4 % (ref 11.5–15.5)
WBC: 5.6 10*3/uL (ref 4.0–10.5)

## 2013-08-24 LAB — URINE MICROSCOPIC-ADD ON

## 2013-08-24 LAB — URINALYSIS, ROUTINE W REFLEX MICROSCOPIC
Bilirubin Urine: NEGATIVE
Glucose, UA: NEGATIVE mg/dL
Hgb urine dipstick: NEGATIVE
Ketones, ur: NEGATIVE mg/dL
NITRITE: NEGATIVE
Protein, ur: NEGATIVE mg/dL
SPECIFIC GRAVITY, URINE: 1.019 (ref 1.005–1.030)
Urobilinogen, UA: 1 mg/dL (ref 0.0–1.0)
pH: 6.5 (ref 5.0–8.0)

## 2013-08-24 LAB — COMPREHENSIVE METABOLIC PANEL
ALT: 17 U/L (ref 0–35)
AST: 21 U/L (ref 0–37)
Albumin: 3.8 g/dL (ref 3.5–5.2)
Alkaline Phosphatase: 94 U/L (ref 39–117)
BILIRUBIN TOTAL: 0.3 mg/dL (ref 0.3–1.2)
BUN: 21 mg/dL (ref 6–23)
CHLORIDE: 102 meq/L (ref 96–112)
CO2: 27 meq/L (ref 19–32)
CREATININE: 0.79 mg/dL (ref 0.50–1.10)
Calcium: 9.5 mg/dL (ref 8.4–10.5)
GFR calc Af Amer: >90 mL/min (ref >90)
GFR, EST NON AFRICAN AMERICAN: 82 mL/min — AB (ref >90)
Glucose, Bld: 83 mg/dL (ref 70–99)
POTASSIUM: 4 meq/L (ref 3.7–5.3)
Sodium: 143 mEq/L (ref 137–147)
Total Protein: 7.1 g/dL (ref 6.0–8.3)

## 2013-08-24 LAB — PROTIME-INR
INR: 1.04 (ref 0.00–1.49)
Prothrombin Time: 13.6 seconds (ref 11.6–15.2)

## 2013-08-24 LAB — SURGICAL PCR SCREEN
MRSA, PCR: NEGATIVE
Staphylococcus aureus: NEGATIVE

## 2013-08-24 LAB — APTT: APTT: 26 s (ref 24–37)

## 2013-08-24 NOTE — Pre-Procedure Instructions (Addendum)
Lauren Montgomery  08/24/2013   Your procedure is scheduled on:  09/02/13  Report to [facility] short stay admitting at 900 AM.  Call this number if you have problems the morning of surgery: [phone]   Remember:   Do not eat food or drink liquids after midnight.   Take these medicines the morning of surgery with A SIP OF WATER: tylenol        Take all meds as ordered until day of surgery except as instructed below or per dr         Bridgette Habermann all herbel meds, nsaids (aleve,naproxen,advil,ibuprofen) 5 days prior to surgery including vitamins, aspirin (08/28/13)   Do not wear jewelry, make-up or nail polish.  Do not wear lotions, powders, or perfumes. You may wear deodorant.  Do not shave 48 hours prior to surgery. Men may shave face and neck.  Do not bring valuables to the hospital.  [facility] is not responsible                  for any belongings or valuables.               Contacts, dentures or bridgework may not be worn into surgery.  Leave suitcase in the car. After surgery it may be brought to your room.  For patients admitted to the hospital, discharge time is determined by your                treatment team.               Patients discharged the day of surgery will not be allowed to drive  home.  Name and phone number of your driver:   Special Instructions:  Special Instructions: [place] - Preparing for Surgery  Before surgery, you can play an important role.  Because skin is not sterile, your skin needs to be as free of germs as possible.  You can reduce the number of germs on you skin by washing with CHG (chlorahexidine gluconate) soap before surgery.  CHG is an antiseptic cleaner which kills germs and bonds with the skin to continue killing germs even after washing.  Please DO NOT use if you have an allergy to CHG or antibacterial soaps.  If your skin becomes reddened/irritated stop using the CHG and inform your nurse when you arrive at Short Stay.  Do not shave (including  legs and underarms) for at least 48 hours prior to the first CHG shower.  You may shave your face.  Please follow these instructions carefully:   1.  Shower with CHG Soap the night before surgery and the morning of Surgery.  2.  If you choose to wash your hair, wash your hair first as usual with your normal shampoo.  3.  After you shampoo, rinse your hair and body thoroughly to remove the Shampoo.  4.  Use CHG as you would any other liquid soap.  You can apply chg directly  to the skin and wash gently with scrungie or a clean washcloth.  5.  Apply the CHG Soap to your body ONLY FROM THE NECK DOWN.  Do not use on open wounds or open sores.  Avoid contact with your eyes ears, mouth and genitals (private parts).  Wash genitals (private parts)       with your normal soap.  6.  Wash thoroughly, paying special attention to the area where your surgery will be performed.  7.  Thoroughly rinse your body with warm  water from the neck down.  8.  DO NOT shower/wash with your normal soap after using and rinsing off the CHG Soap.  9.  Pat yourself dry with a clean towel.            10.  Wear clean pajamas.            11.  Place clean sheets on your bed the night of your first shower and do not sleep with pets.  Day of Surgery  Do not apply any lotions/deodorants the morning of surgery.  Please wear clean clothes to the hospital/surgery center.   Please read over the following fact sheets that you were given: Pain Booklet, Coughing and Deep Breathing, Blood Transfusion Information, Total Joint Packet, MRSA Information and Surgical Site Infection Prevention

## 2013-08-25 LAB — URINE CULTURE

## 2013-09-01 MED ORDER — CHLORHEXIDINE GLUCONATE 4 % EX LIQD
60.0000 mL | Freq: Once | CUTANEOUS | Status: DC
  Filled 2013-09-01: qty 60

## 2013-09-01 MED ORDER — CEFAZOLIN SODIUM-DEXTROSE 2-3 GM-% IV SOLR
2.0000 g | INTRAVENOUS | Status: AC
  Administered 2013-09-02: 2 g via INTRAVENOUS
  Filled 2013-09-01: qty 50

## 2013-09-01 MED ORDER — TRANEXAMIC ACID 100 MG/ML IV SOLN
1000.0000 mg | INTRAVENOUS | Status: AC
  Administered 2013-09-02: 1000 mg via INTRAVENOUS
  Filled 2013-09-01: qty 10

## 2013-09-01 NOTE — Progress Notes (Signed)
Message left on patient's voice mail to arrive at 8am with other instructions remaining the same and to call back to verify receipt of message

## 2013-09-01 NOTE — Progress Notes (Signed)
Patient notified to arrive at 0800 in the morning.

## 2013-09-02 ENCOUNTER — Inpatient Hospital Stay (HOSPITAL_COMMUNITY): Payer: Medicare Other | Admitting: Certified Registered"

## 2013-09-02 ENCOUNTER — Inpatient Hospital Stay (HOSPITAL_COMMUNITY)
Admission: RE | Admit: 2013-09-02 | Discharge: 2013-09-03 | DRG: 470 | Disposition: A | Discharge status: Home Health | Payer: Medicare Other | Source: Ambulatory Visit | Attending: Orthopedic Surgery | Admitting: Orthopedic Surgery

## 2013-09-02 ENCOUNTER — Inpatient Hospital Stay (HOSPITAL_COMMUNITY): Payer: Medicare Other

## 2013-09-02 ENCOUNTER — Encounter (HOSPITAL_COMMUNITY)
Admission: RE | Disposition: A | Discharge status: Home Health | Payer: Self-pay | Source: Ambulatory Visit | Attending: Orthopedic Surgery

## 2013-09-02 ENCOUNTER — Encounter (HOSPITAL_COMMUNITY): Payer: Medicare Other | Admitting: Certified Registered"

## 2013-09-02 ENCOUNTER — Encounter (HOSPITAL_COMMUNITY): Payer: Self-pay | Admitting: *Deleted

## 2013-09-02 DIAGNOSIS — Z96649 Presence of unspecified artificial hip joint: Secondary | ICD-10-CM

## 2013-09-02 DIAGNOSIS — M169 Osteoarthritis of hip, unspecified: Principal | ICD-10-CM | POA: Diagnosis present

## 2013-09-02 DIAGNOSIS — D62 Acute posthemorrhagic anemia: Secondary | ICD-10-CM | POA: Diagnosis not present

## 2013-09-02 DIAGNOSIS — IMO0002 Reserved for concepts with insufficient information to code with codable children: Secondary | ICD-10-CM

## 2013-09-02 DIAGNOSIS — Z885 Allergy status to narcotic agent status: Secondary | ICD-10-CM

## 2013-09-02 DIAGNOSIS — M1610 Unilateral primary osteoarthritis, unspecified hip: Principal | ICD-10-CM | POA: Diagnosis present

## 2013-09-02 HISTORY — PX: TOTAL HIP ARTHROPLASTY: SHX124

## 2013-09-02 SURGERY — ARTHROPLASTY, HIP, TOTAL, ANTERIOR APPROACH
Anesthesia: General | Site: Hip | Laterality: Right

## 2013-09-02 MED ORDER — PROPOFOL 10 MG/ML IV BOLUS
INTRAVENOUS | Status: DC | PRN
  Administered 2013-09-02: 150 mg via INTRAVENOUS
  Administered 2013-09-02: 20 mg via INTRAVENOUS

## 2013-09-02 MED ORDER — LACTATED RINGERS IV SOLN
INTRAVENOUS | Status: DC | PRN
  Administered 2013-09-02 (×2): via INTRAVENOUS

## 2013-09-02 MED ORDER — METHOCARBAMOL 500 MG PO TABS
500.0000 mg | ORAL_TABLET | Freq: Four times a day (QID) | ORAL | Status: DC | PRN
  Filled 2013-09-02: qty 1

## 2013-09-02 MED ORDER — PROMETHAZINE HCL 25 MG/ML IJ SOLN: 6.2500 mg | INTRAMUSCULAR | Status: DC | PRN

## 2013-09-02 MED ORDER — ALBUTEROL SULFATE HFA 108 (90 BASE) MCG/ACT IN AERS
INHALATION_SPRAY | RESPIRATORY_TRACT | Status: DC | PRN
  Administered 2013-09-02: 2 via RESPIRATORY_TRACT

## 2013-09-02 MED ORDER — CEFAZOLIN SODIUM 1-5 GM-% IV SOLN
1.0000 g | Freq: Four times a day (QID) | INTRAVENOUS | Status: AC
  Administered 2013-09-02 (×2): 1 g via INTRAVENOUS
  Filled 2013-09-02 (×2): qty 50

## 2013-09-02 MED ORDER — ONDANSETRON HCL 4 MG/2ML IJ SOLN
INTRAMUSCULAR | Status: DC | PRN
  Administered 2013-09-02: 4 mg via INTRAVENOUS

## 2013-09-02 MED ORDER — DOCUSATE SODIUM 100 MG PO CAPS
100.0000 mg | ORAL_CAPSULE | Freq: Two times a day (BID) | ORAL | Status: DC
  Administered 2013-09-02 – 2013-09-03 (×2): 100 mg via ORAL
  Filled 2013-09-02 (×2): qty 1

## 2013-09-02 MED ORDER — BISACODYL 5 MG PO TBEC: 5.0000 mg | DELAYED_RELEASE_TABLET | Freq: Every day | ORAL | Status: DC | PRN

## 2013-09-02 MED ORDER — MEPERIDINE HCL 25 MG/ML IJ SOLN: 6.2500 mg | INTRAMUSCULAR | Status: DC | PRN

## 2013-09-02 MED ORDER — METHOCARBAMOL 1000 MG/10ML IJ SOLN
500.0000 mg | Freq: Four times a day (QID) | INTRAVENOUS | Status: DC | PRN
  Administered 2013-09-02: 500 mg via INTRAVENOUS
  Filled 2013-09-02 (×2): qty 5

## 2013-09-02 MED ORDER — ONDANSETRON HCL 4 MG PO TABS: 4.0000 mg | ORAL_TABLET | Freq: Three times a day (TID) | ORAL | Status: DC | PRN

## 2013-09-02 MED ORDER — PROPOFOL 10 MG/ML IV BOLUS
INTRAVENOUS | Status: AC
  Filled 2013-09-02: qty 20

## 2013-09-02 MED ORDER — ACETAMINOPHEN 325 MG PO TABS: 650.0000 mg | ORAL_TABLET | Freq: Four times a day (QID) | ORAL | Status: DC | PRN

## 2013-09-02 MED ORDER — ALUM & MAG HYDROXIDE-SIMETH 200-200-20 MG/5ML PO SUSP
30.0000 mL | ORAL | Status: DC | PRN
  Administered 2013-09-03: 30 mL via ORAL
  Filled 2013-09-02: qty 30

## 2013-09-02 MED ORDER — LIDOCAINE HCL (CARDIAC) 20 MG/ML IV SOLN
INTRAVENOUS | Status: DC | PRN
  Administered 2013-09-02: 60 mg via INTRAVENOUS

## 2013-09-02 MED ORDER — ALBUMIN HUMAN 5 % IV SOLN
INTRAVENOUS | Status: DC | PRN
  Administered 2013-09-02 (×2): via INTRAVENOUS

## 2013-09-02 MED ORDER — PHENYLEPHRINE 40 MCG/ML (10ML) SYRINGE FOR IV PUSH (FOR BLOOD PRESSURE SUPPORT)
PREFILLED_SYRINGE | INTRAVENOUS | Status: AC
  Filled 2013-09-02: qty 10

## 2013-09-02 MED ORDER — DIPHENHYDRAMINE HCL 12.5 MG/5ML PO ELIX: 12.5000 mg | ORAL_SOLUTION | ORAL | Status: DC | PRN

## 2013-09-02 MED ORDER — MIDAZOLAM HCL 2 MG/2ML IJ SOLN: 0.5000 mg | Freq: Once | INTRAMUSCULAR | Status: DC | PRN

## 2013-09-02 MED ORDER — MIDAZOLAM HCL 2 MG/2ML IJ SOLN
INTRAMUSCULAR | Status: AC
  Filled 2013-09-02: qty 2

## 2013-09-02 MED ORDER — GLYCOPYRROLATE 0.2 MG/ML IJ SOLN
INTRAMUSCULAR | Status: DC | PRN
  Administered 2013-09-02: 0.6 mg via INTRAVENOUS

## 2013-09-02 MED ORDER — SUCCINYLCHOLINE CHLORIDE 20 MG/ML IJ SOLN
INTRAMUSCULAR | Status: AC
  Filled 2013-09-02: qty 1

## 2013-09-02 MED ORDER — HYDROMORPHONE HCL PF 1 MG/ML IJ SOLN
INTRAMUSCULAR | Status: AC
  Administered 2013-09-02: 0.5 mg via INTRAVENOUS
  Filled 2013-09-02: qty 1

## 2013-09-02 MED ORDER — 0.9 % SODIUM CHLORIDE (POUR BTL) OPTIME
TOPICAL | Status: DC | PRN
  Administered 2013-09-02: 1000 mL

## 2013-09-02 MED ORDER — ACETAMINOPHEN 650 MG RE SUPP: 650.0000 mg | Freq: Four times a day (QID) | RECTAL | Status: DC | PRN

## 2013-09-02 MED ORDER — CELECOXIB 200 MG PO CAPS
200.0000 mg | ORAL_CAPSULE | Freq: Two times a day (BID) | ORAL | Status: DC
  Administered 2013-09-02 – 2013-09-03 (×2): 200 mg via ORAL
  Filled 2013-09-02 (×3): qty 1

## 2013-09-02 MED ORDER — ROCURONIUM BROMIDE 100 MG/10ML IV SOLN
INTRAVENOUS | Status: DC | PRN
  Administered 2013-09-02: 10 mg via INTRAVENOUS
  Administered 2013-09-02: 50 mg via INTRAVENOUS
  Administered 2013-09-02: 5 mg via INTRAVENOUS

## 2013-09-02 MED ORDER — ASPIRIN EC 325 MG PO TBEC
325.0000 mg | DELAYED_RELEASE_TABLET | Freq: Every day | ORAL | Status: DC
  Administered 2013-09-03: 325 mg via ORAL
  Filled 2013-09-02 (×2): qty 1

## 2013-09-02 MED ORDER — BUPIVACAINE LIPOSOME 1.3 % IJ SUSP
20.0000 mL | Freq: Once | INTRAMUSCULAR | Status: DC
  Filled 2013-09-02: qty 20

## 2013-09-02 MED ORDER — HYDROMORPHONE HCL PF 1 MG/ML IJ SOLN
0.5000 mg | INTRAMUSCULAR | Status: DC | PRN
  Administered 2013-09-02: 1 mg via INTRAVENOUS
  Filled 2013-09-02: qty 1

## 2013-09-02 MED ORDER — OXYCODONE HCL 5 MG PO TABS: 5.0000 mg | ORAL_TABLET | Freq: Once | ORAL | Status: DC | PRN

## 2013-09-02 MED ORDER — HYDROMORPHONE HCL 2 MG PO TABS: 2.0000 mg | ORAL_TABLET | ORAL | Status: DC | PRN

## 2013-09-02 MED ORDER — METHOCARBAMOL 500 MG PO TABS: 500.0000 mg | ORAL_TABLET | Freq: Four times a day (QID) | ORAL | Status: DC

## 2013-09-02 MED ORDER — METOCLOPRAMIDE HCL 10 MG PO TABS
5.0000 mg | ORAL_TABLET | Freq: Three times a day (TID) | ORAL | Status: DC | PRN
  Administered 2013-09-02: 10 mg via ORAL
  Filled 2013-09-02: qty 1

## 2013-09-02 MED ORDER — POTASSIUM CHLORIDE IN NACL 20-0.9 MEQ/L-% IV SOLN
INTRAVENOUS | Status: DC
  Administered 2013-09-03: 07:00:00 via INTRAVENOUS
  Filled 2013-09-02 (×3): qty 1000

## 2013-09-02 MED ORDER — MENTHOL 3 MG MT LOZG: 1.0000 | LOZENGE | OROMUCOSAL | Status: DC | PRN

## 2013-09-02 MED ORDER — FENTANYL CITRATE 0.05 MG/ML IJ SOLN
INTRAMUSCULAR | Status: DC | PRN
  Administered 2013-09-02 (×2): 50 ug via INTRAVENOUS
  Administered 2013-09-02 (×2): 25 ug via INTRAVENOUS
  Administered 2013-09-02: 100 ug via INTRAVENOUS

## 2013-09-02 MED ORDER — ASPIRIN EC 325 MG PO TBEC: 325.0000 mg | DELAYED_RELEASE_TABLET | Freq: Every day | ORAL | Status: DC

## 2013-09-02 MED ORDER — PHENYLEPHRINE HCL 10 MG/ML IJ SOLN
INTRAMUSCULAR | Status: AC
  Filled 2013-09-02: qty 1

## 2013-09-02 MED ORDER — ONDANSETRON HCL 4 MG/2ML IJ SOLN
4.0000 mg | Freq: Four times a day (QID) | INTRAMUSCULAR | Status: DC | PRN
  Administered 2013-09-02: 4 mg via INTRAVENOUS
  Filled 2013-09-02: qty 2

## 2013-09-02 MED ORDER — LACTATED RINGERS IV SOLN
INTRAVENOUS | Status: DC
  Administered 2013-09-02: 09:00:00 via INTRAVENOUS

## 2013-09-02 MED ORDER — NEOSTIGMINE METHYLSULFATE 10 MG/10ML IV SOLN
INTRAVENOUS | Status: DC | PRN
  Administered 2013-09-02: 4 mg via INTRAVENOUS

## 2013-09-02 MED ORDER — METOCLOPRAMIDE HCL 5 MG/ML IJ SOLN: 5.0000 mg | Freq: Three times a day (TID) | INTRAMUSCULAR | Status: DC | PRN

## 2013-09-02 MED ORDER — PHENYLEPHRINE HCL 10 MG/ML IJ SOLN
INTRAMUSCULAR | Status: DC | PRN
  Administered 2013-09-02 (×3): 40 ug via INTRAVENOUS
  Administered 2013-09-02: 80 ug via INTRAVENOUS
  Administered 2013-09-02 (×3): 40 ug via INTRAVENOUS
  Administered 2013-09-02: 80 ug via INTRAVENOUS
  Administered 2013-09-02 (×3): 40 ug via INTRAVENOUS

## 2013-09-02 MED ORDER — FENTANYL CITRATE 0.05 MG/ML IJ SOLN
INTRAMUSCULAR | Status: AC
  Filled 2013-09-02: qty 5

## 2013-09-02 MED ORDER — ONDANSETRON HCL 4 MG/2ML IJ SOLN
INTRAMUSCULAR | Status: AC
  Filled 2013-09-02: qty 2

## 2013-09-02 MED ORDER — HYDROMORPHONE HCL PF 1 MG/ML IJ SOLN
0.2500 mg | INTRAMUSCULAR | Status: DC | PRN
  Administered 2013-09-02 (×2): 0.5 mg via INTRAVENOUS

## 2013-09-02 MED ORDER — OXYCODONE HCL 5 MG/5ML PO SOLN: 5.0000 mg | Freq: Once | ORAL | Status: DC | PRN

## 2013-09-02 MED ORDER — PHENOL 1.4 % MT LIQD: 1.0000 | OROMUCOSAL | Status: DC | PRN

## 2013-09-02 MED ORDER — LIDOCAINE HCL (CARDIAC) 20 MG/ML IV SOLN
INTRAVENOUS | Status: AC
  Filled 2013-09-02: qty 10

## 2013-09-02 MED ORDER — SODIUM CHLORIDE 0.9 % IV SOLN
INTRAVENOUS | Status: DC | PRN
  Administered 2013-09-02: 10:00:00

## 2013-09-02 MED ORDER — MIDAZOLAM HCL 5 MG/5ML IJ SOLN
INTRAMUSCULAR | Status: DC | PRN
  Administered 2013-09-02: 2 mg via INTRAVENOUS

## 2013-09-02 MED ORDER — ONDANSETRON HCL 4 MG PO TABS: 4.0000 mg | ORAL_TABLET | Freq: Four times a day (QID) | ORAL | Status: DC | PRN

## 2013-09-02 MED ORDER — ROCURONIUM BROMIDE 50 MG/5ML IV SOLN
INTRAVENOUS | Status: AC
  Filled 2013-09-02: qty 1

## 2013-09-02 MED ORDER — DEXAMETHASONE SODIUM PHOSPHATE 10 MG/ML IJ SOLN
INTRAMUSCULAR | Status: DC | PRN
  Administered 2013-09-02: 8 mg via INTRAVENOUS

## 2013-09-02 SURGICAL SUPPLY — 63 items
BLADE SAW SGTL 18X1.27X75 (BLADE) ×2 IMPLANT
BLADE SAW SGTL 18X1.27X75MM (BLADE) ×1
BLADE SURG ROTATE 9660 (MISCELLANEOUS) IMPLANT
CLEANER TIP ELECTROSURG 2X2 (MISCELLANEOUS) ×2 IMPLANT
CLOSURE WOUND 1/2 X4 (GAUZE/BANDAGES/DRESSINGS) ×2
COVER SURGICAL LIGHT HANDLE (MISCELLANEOUS) ×3 IMPLANT
DRAPE IMP U-DRAPE 54X76 (DRAPES) ×6 IMPLANT
DRAPE INCISE IOBAN 66X45 STRL (DRAPES) ×3 IMPLANT
DRAPE ORTHO SPLIT 77X108 STRL (DRAPES) ×6
DRAPE PROXIMA HALF (DRAPES) ×3 IMPLANT
DRAPE SURG 17X23 STRL (DRAPES) ×3 IMPLANT
DRAPE SURG ORHT 6 SPLT 77X108 (DRAPES) ×2 IMPLANT
DRAPE U-SHAPE 47X51 STRL (DRAPES) ×3 IMPLANT
DRSG AQUACEL AG ADV 3.5X10 (GAUZE/BANDAGES/DRESSINGS) ×3 IMPLANT
DRSG AQUACEL AG ADV 3.5X14 (GAUZE/BANDAGES/DRESSINGS) ×2 IMPLANT
DURAPREP 26ML APPLICATOR (WOUND CARE) ×3 IMPLANT
ELECT BLADE 4.0 EZ CLEAN MEGAD (MISCELLANEOUS)
ELECT CAUTERY BLADE 6.4 (BLADE) ×3 IMPLANT
ELECT REM PT RETURN 9FT ADLT (ELECTROSURGICAL) ×3
ELECTRODE BLDE 4.0 EZ CLN MEGD (MISCELLANEOUS) IMPLANT
ELECTRODE REM PT RTRN 9FT ADLT (ELECTROSURGICAL) ×1 IMPLANT
FACESHIELD WRAPAROUND (MASK) ×6 IMPLANT
FACESHIELD WRAPAROUND OR TEAM (MASK) ×2 IMPLANT
GLOVE BIO SURGEON STRL SZ 6.5 (GLOVE) ×2 IMPLANT
GLOVE BIO SURGEON STRL SZ7 (GLOVE) ×4 IMPLANT
GLOVE BIO SURGEONS STRL SZ 6.5 (GLOVE) ×2
GLOVE BIOGEL PI IND STRL 6.5 (GLOVE) IMPLANT
GLOVE BIOGEL PI IND STRL 7.0 (GLOVE) IMPLANT
GLOVE BIOGEL PI INDICATOR 6.5 (GLOVE) ×6
GLOVE BIOGEL PI INDICATOR 7.0 (GLOVE) ×2
GLOVE ECLIPSE 6.5 STRL STRAW (GLOVE) IMPLANT
GLOVE ORTHO TXT STRL SZ7.5 (GLOVE) ×3 IMPLANT
GOWN STRL REUS W/ TWL LRG LVL3 (GOWN DISPOSABLE) ×3 IMPLANT
GOWN STRL REUS W/ TWL XL LVL3 (GOWN DISPOSABLE) ×1 IMPLANT
GOWN STRL REUS W/TWL LRG LVL3 (GOWN DISPOSABLE) ×27
GOWN STRL REUS W/TWL XL LVL3 (GOWN DISPOSABLE) ×3
HANDPIECE INTERPULSE COAX TIP (DISPOSABLE)
HIP/CERM HD VIT E LINR LEV 1C ×2 IMPLANT
KIT BASIN OR (CUSTOM PROCEDURE TRAY) ×3 IMPLANT
KIT ROOM TURNOVER OR (KITS) ×3 IMPLANT
MANIFOLD NEPTUNE II (INSTRUMENTS) ×3 IMPLANT
NDL SAFETY ECLIPSE 18X1.5 (NEEDLE) ×1 IMPLANT
NEEDLE HYPO 18GX1.5 SHARP (NEEDLE) ×3
NS IRRIG 1000ML POUR BTL (IV SOLUTION) ×3 IMPLANT
PACK TOTAL JOINT (CUSTOM PROCEDURE TRAY) ×3 IMPLANT
PAD ARMBOARD 7.5X6 YLW CONV (MISCELLANEOUS) ×6 IMPLANT
SET HNDPC FAN SPRY TIP SCT (DISPOSABLE) IMPLANT
STRIP CLOSURE SKIN 1/2X4 (GAUZE/BANDAGES/DRESSINGS) ×2 IMPLANT
SUT ETHIBOND NAB CT1 #1 30IN (SUTURE) IMPLANT
SUT MNCRL AB 4-0 PS2 18 (SUTURE) ×3 IMPLANT
SUT MON AB 2-0 CT1 27 (SUTURE) ×3 IMPLANT
SUT VIC AB 0 CT1 27 (SUTURE) ×12
SUT VIC AB 0 CT1 27XBRD ANBCTR (SUTURE) ×1 IMPLANT
SUT VIC AB 1 CT1 27 (SUTURE) ×3
SUT VIC AB 1 CT1 27XBRD ANBCTR (SUTURE) ×1 IMPLANT
SUT VIC AB 2-0 CT1 27 (SUTURE) ×6
SUT VIC AB 2-0 CT1 TAPERPNT 27 (SUTURE) IMPLANT
SYR 50ML LL SCALE MARK (SYRINGE) ×3 IMPLANT
TOWEL OR 17X24 6PK STRL BLUE (TOWEL DISPOSABLE) ×3 IMPLANT
TOWEL OR 17X26 10 PK STRL BLUE (TOWEL DISPOSABLE) ×3 IMPLANT
TRAY FOLEY CATH 14FR (SET/KITS/TRAYS/PACK) ×2 IMPLANT
WATER STERILE IRR 1000ML POUR (IV SOLUTION) ×6 IMPLANT
YANKAUER SUCT BULB TIP NO VENT (SUCTIONS) ×2 IMPLANT

## 2013-09-02 NOTE — Addendum Note (Signed)
Addendum created 09/02/13 1357 by Laretta Alstrom, CRNA   Modules edited: Anesthesia Events

## 2013-09-02 NOTE — Discharge Instructions (Signed)
Total Hip Replacement Care After Refer to this sheet in the next few weeks. These instructions provide you with information on caring for yourself after your procedure. Your caregiver also may give you specific instructions. Your treatment has been planned according to the most current medical practices, but problems sometimes occur. Call your caregiver if you have any problems or questions after your procedure. HOME CARE INSTRUCTIONS   Weight bearing as tolerated.  Take Aspirin 1 tab a day for the next 30 days to prevent blood clots.  Change dressing daily starting on Saturday.  May take a shower on Monday, but do not soak incision.  May apply ice for up to 20 minutes at a time for pain and swelling.  Follow up appointment in two weeks.   Your caregiver will give you specific precautions for certain types of movement. Additional instructions include:  Take over-the-counter or prescription medicines for pain, discomfort, or fever only as directed by your caregiver.  Take quick showers (3-5 min) rather than bathe until your caregiver tells you that you can take baths again.  Avoid lifting until your caregiver instructs you otherwise.  Use a raised toilet seat and avoid sitting in low chairs as instructed by your caregiver.  Use crutches or a walker as instructed by your caregiver. SEEK MEDICAL CARE IF:  You have difficulty breathing.  Your wound is red, swollen, or has become increasingly painful.  You have pus draining from your wound.  You have a bad smell coming from your wound.  You have persistent bleeding from your wound.  Your wound breaks open after sutures (stitches) or staples have been removed. SEEK IMMEDIATE MEDICAL CARE IF:   You have a fever.  You have a rash.  You have pain or swelling in your calf or thigh.  You have shortness of breath or chest pain. MAKE SURE YOU:  Understand these instructions.  Will watch your condition.  Will get help right away if  you are not doing well or get worse. Document Released: 09/01/2004 Document Revised: 08/14/2011 Document Reviewed: 04/01/2011 [facility] Patient Information 2015 [place], [place]. This information is not intended to replace advice given to you by your health care provider. Make sure you discuss any questions you have with your health care provider.

## 2013-09-02 NOTE — H&P (View-Only) (Signed)
TOTAL HIP ADMISSION H&P  Patient is admitted for right total hip arthroplasty.  Subjective:  Chief Complaint: right hip pain  HPI: Lauren Montgomery, 68 y.o. female, has a history of pain and functional disability in the right hip(s) due to arthritis and patient has failed non-surgical conservative treatments for greater than 12 weeks to include NSAID's and/or analgesics and activity modification.  Onset of symptoms was gradual starting 1 years ago with gradually worsening course since that time.The patient noted no past surgery on the right hip(s).  Patient currently rates pain in the right hip at 5 out of 10 with activity. Patient has trendelenberg gait. Patient has evidence of subchondral sclerosis and joint space narrowing by imaging studies. This condition presents safety issues increasing the risk of falls.  There is no current active infection.  There are no active problems to display for this patient.  Past Medical History  Diagnosis Date  . Skin cancer     nose  . Blood transfusion     with childbirth  . Osteoarthritis   . Cataract     left eye  . Degenerative joint disease   . PONV (postoperative nausea and vomiting)     Past Surgical History  Procedure Laterality Date  . Bone anchored hearing aid implant    . Carpal tunnel release      right hand  . Tubal ligation    . Bunionectomy    . Total hip arthroplasty  03/14/2011    Procedure: TOTAL HIP ARTHROPLASTY;  Surgeon: [name], MD;  Location: [facility];  Service: Orthopedics;  Laterality: Left;  120 MINUTES FOR THIS SURGERY     (Not in a hospital admission) Allergies  Allergen Reactions  . Codeine Nausea Only    History  Substance Use Topics  . Smoking status: Never Smoker   . Smokeless tobacco: Never Used  . Alcohol Use: No    Family History  Problem Relation Age of Onset  . Adopted: Yes     Review of Systems  Constitutional: Negative.   HENT: Positive for hearing loss. Negative for tinnitus.   Eyes:  Negative.   Respiratory: Negative.   Cardiovascular: Negative.   Gastrointestinal: Negative.   Genitourinary: Negative.   Musculoskeletal: Positive for joint pain.  Skin: Negative.   Neurological: Negative.  Negative for headaches.  Endo/Heme/Allergies: Negative.   Psychiatric/Behavioral: Negative.     Objective:  Physical Exam  Constitutional: She is oriented to person, place, and time. She appears well-developed and well-nourished.  HENT:  Head: Normocephalic and atraumatic.  Eyes: EOM are normal. Pupils are equal, round, and reactive to light.  Neck: Normal range of motion. Neck supple.  Cardiovascular: Normal rate and regular rhythm.  Exam reveals no gallop and no friction rub.   No murmur heard. Respiratory: Effort normal and breath sounds normal. No respiratory distress. She has no wheezes. She has no rales.  GI: Soft. Bowel sounds are normal.  Musculoskeletal:  antalgic gait with a significant Trendelenburg component on the right.  Left hip has a negative log roll and good motion.  Both knees have motion from 5-100.  Tibiofemoral and patellofemoral crepitus.  5 degrees of varus.  Neurovascularly intact distally.  Her right hip has a markedly positive log roll.  Essentially no internal rotation.  Limited flexion and abduction.    Neurological: She is alert and oriented to person, place, and time.  Skin: Skin is warm and dry.  Psychiatric: She has a normal mood and affect. Her  behavior is normal. Judgment and thought content normal.    Vital signs in last 24 hours: @VSRANGES @  Labs:   Estimated body mass index is 28.18 kg/(m^2) as calculated from the following:   Height as of 03/14/11: 5\' 2"  (1.575 m).   Weight as of 03/09/11: 69.899 kg (154 lb 1.6 oz).   Imaging Review Plain radiographs demonstrate severe degenerative joint disease of the right hip(s). The bone quality appears to be fair for age and reported activity level.  Assessment/Plan:  End stage arthritis,  right hip(s)  The patient history, physical examination, clinical judgement of the Lauren Montgomery and imaging studies are consistent with end stage degenerative joint disease of the right hip(s) and total hip arthroplasty is deemed medically necessary. The treatment options including medical management, injection therapy, arthroscopy and arthroplasty were discussed at length. The risks and benefits of total hip arthroplasty were presented and reviewed. The risks due to aseptic loosening, infection, stiffness, dislocation/subluxation,  thromboembolic complications and other imponderables were discussed.  The patient acknowledged the explanation, agreed to proceed with the plan and consent was signed. Patient is being admitted for inpatient treatment for surgery, pain control, PT, OT, prophylactic antibiotics, VTE prophylaxis, progressive ambulation and ADL's and discharge planning.The patient is planning to be discharged home with home health services

## 2013-09-02 NOTE — Transfer of Care (Signed)
Immediate Anesthesia Transfer of Care Note  Patient: Lauren Montgomery  Procedure(s) Performed: Procedure(s): TOTAL HIP ARTHROPLASTY ANTERIOR APPROACH: Disolacted trial head resection muscular abdominal inguinal ligament. (Right)  Patient Location: PACU  Anesthesia Type:General  Level of Consciousness: awake, alert  and oriented  Airway & Oxygen Therapy: Patient connected to face mask oxygen  Post-op Assessment: Report given to PACU RN  Post vital signs: stable  Complications: No apparent anesthesia complications

## 2013-09-02 NOTE — Progress Notes (Signed)
Utilization review completed.  

## 2013-09-02 NOTE — Op Note (Signed)
Preop diagnosis: Displaced femoral head prosthesis Postop diagnosis: Same Procedure: Retrieval of displaced femoral head prosthesis Surgeon:Lilyrose Tanney K.   Indications: I was called emergently to the operating room by Dr. Amada Jupiter. He is performing an anterior hip replacement. The trial femoral head prosthetic was displaced and has tracked superiorly and retromuscular. He is unable to retrieve it from the surgical approach. I was asked to help retrieve it.  Description of procedure: I extended his incision superiorly slightly. We identified the external oblique muscle. I made an oblique incision and bluntly dissected through the muscle. We dissected to the internal oblique. I inserted a finger and I couldn't palpate the femoral head prosthetic. We were able to manually push this back down towards the groin and delivered into the field. We then examined our muscular incision. There was a small hole the peritoneum. The bowel appeared to be healthy. The fascia was reapproximated with running 0 Vicryl. The peritoneum was closed with our fascial closure. The case is turned back over to Dr. Percell Miller for completion.  Imogene Burn. Georgette Dover, MD, [facility] Surgery  General/ Trauma Surgery  09/02/2013 12:21 PM

## 2013-09-02 NOTE — Anesthesia Preprocedure Evaluation (Addendum)
Anesthesia Evaluation  Patient identified by MRN, date of birth, ID band Patient awake    Reviewed: Allergy & Precautions, H&P , NPO status , Patient's Chart, lab work & pertinent test results  History of Anesthesia Complications (+) PONV and history of anesthetic complications  Airway Mallampati: II TM Distance: >3 FB Neck ROM: Full    Dental  (+) Teeth Intact, Dental Advisory Given   Pulmonary neg pulmonary ROS,  breath sounds clear to auscultation  Pulmonary exam normal       Cardiovascular - anginanegative cardio ROS  Rhythm:Regular Rate:Normal     Neuro/Psych negative neurological ROS  negative psych ROS   GI/Hepatic negative GI ROS, Neg liver ROS,   Endo/Other  negative endocrine ROS  Renal/GU negative Renal ROS     Musculoskeletal negative musculoskeletal ROS (+)   Abdominal   Peds  Hematology negative hematology ROS (+)   Anesthesia Other Findings   Reproductive/Obstetrics negative OB ROS                        Anesthesia Physical Anesthesia Plan  ASA: II  Anesthesia Plan: General   Post-op Pain Management:    Induction: Intravenous  Airway Management Planned: Oral ETT  Additional Equipment:   Intra-op Plan:   Post-operative Plan: Extubation in OR  Informed Consent: I have reviewed the patients History and Physical, chart, labs and discussed the procedure including the risks, benefits and alternatives for the proposed anesthesia with the patient or authorized representative who has indicated his/her understanding and acceptance.   Dental advisory given  Plan Discussed with: CRNA and Surgeon  Anesthesia Plan Comments: (Plan routine monitors, GA)       Anesthesia Quick Evaluation

## 2013-09-02 NOTE — Interval H&P Note (Signed)
History and Physical Interval Note:  09/02/2013 8:28 AM  Lauren Montgomery  has presented today for surgery, with the diagnosis of DJD right hip  The various methods of treatment have been discussed with the patient and family. After consideration of risks, benefits and other options for treatment, the patient has consented to  Procedure(s): TOTAL HIP ARTHROPLASTY ANTERIOR APPROACH (Right) as a surgical intervention .  The patient's history has been reviewed, patient examined, no change in status, stable for surgery.  I have reviewed the patient's chart and labs.  Questions were answered to the patient's satisfaction.     Renate Danh F

## 2013-09-02 NOTE — Anesthesia Procedure Notes (Signed)
Procedure Name: Intubation Date/Time: 09/02/2013 9:49 AM Performed by: Maeola Harman Pre-anesthesia Checklist: Patient identified, Emergency Drugs available, Suction available, Patient being monitored and Timeout performed Patient Re-evaluated:Patient Re-evaluated prior to inductionOxygen Delivery Method: Circle system utilized Preoxygenation: Pre-oxygenation with 100% oxygen Intubation Type: IV induction Ventilation: Mask ventilation without difficulty Laryngoscope Size: Mac and 3 Grade View: Grade II Tube type: Oral Tube size: 7.5 mm Number of attempts: 1 Placement Confirmation: ETT inserted through vocal cords under direct vision,  positive ETCO2 and breath sounds checked- equal and bilateral Secured at: 22 cm Tube secured with: Tape Dental Injury: Teeth and Oropharynx as per pre-operative assessment  Comments: Easy atraumatic induction and intubation with MAC 3 blade.  Dr. Al Corpus verified placement of ETT.  Waldron Session, CRNA

## 2013-09-02 NOTE — Anesthesia Postprocedure Evaluation (Signed)
  Anesthesia Post-op Note  Patient: Lauren Montgomery  Procedure(s) Performed: Procedure(s): TOTAL HIP ARTHROPLASTY ANTERIOR APPROACH: Disolacted trial head resection muscular abdominal inguinal ligament. (Right)  Patient Location: PACU  Anesthesia Type:General  Level of Consciousness: awake, alert  and oriented  Airway and Oxygen Therapy: Patient Spontanous Breathing and Patient connected to face mask oxygen  Post-op Pain: mild  Post-op Assessment: Post-op Vital signs reviewed  Post-op Vital Signs: Reviewed  Last Vitals:  Filed Vitals:   09/02/13 1315  BP: 103/42  Pulse: 63  Temp:   Resp: 12    Complications: No apparent anesthesia complications

## 2013-09-02 NOTE — Progress Notes (Signed)
Report given to Philip RN

## 2013-09-02 NOTE — Discharge Summary (Signed)
Patient ID: Lauren Montgomery MRN: [id number] DOB/AGE: 70-01-1946 70 y.o.  Admit date: 09/02/2013 Discharge date: 09/03/13  Admission Diagnoses:  Active Problems:   Primary localized osteoarthrosis, pelvic region and thigh   Discharge Diagnoses:  Same ABLA: 13.3 pre-op and 9.7 post-op.    Past Medical History  Diagnosis Date  . Skin cancer     nose  . Blood transfusion     with childbirth  . Osteoarthritis   . Cataract     left eye  . Degenerative joint disease   . PONV (postoperative nausea and vomiting)     denies,   . Tuberculosis     mom passed from tb- raised in orphan[facility]    Surgeries: Procedure(s): TOTAL HIP ARTHROPLASTY ANTERIOR APPROACH: Disolacted trial head resection muscular abdominal inguinal ligament. on 09/02/2013   Consultants:    Discharged Condition: Improved ABLA: 13.3 pre-op and 9.7 post-op. Patient asymptomatic but will continue to follow.  Hospital Course: Lauren Montgomery is an 68 y.o. female who was admitted 09/02/2013 for operative treatment of<principal problem not specified>. Patient has severe unremitting pain that affects sleep, daily activities, and work/hobbies. After pre-op clearance the patient was taken to the operating room on 09/02/2013 and underwent  Procedure(s): TOTAL HIP ARTHROPLASTY ANTERIOR APPROACH: Disolacted trial head resection muscular abdominal inguinal ligament..    Patient was given perioperative antibiotics:     Anti-infectives   Start     Dose/Rate Route Frequency Ordered Stop   09/02/13 1700  ceFAZolin (ANCEF) IVPB 1 g/50 mL premix     1 g 100 mL/hr over 30 Minutes Intravenous Every 6 hours 09/02/13 1555 09/02/13 2345   09/02/13 0600  ceFAZolin (ANCEF) IVPB 2 g/50 mL premix     2 g 100 mL/hr over 30 Minutes Intravenous On call to O.R. 09/01/13 1404 09/02/13 0949       Patient was given sequential compression devices, early ambulation, and chemoprophylaxis to prevent DVT.  Patient benefited maximally from hospital stay and  there were no complications.    Recent vital signs:  Patient Vitals for the past 24 hrs:  BP Temp Temp src Pulse Resp SpO2 Height Weight  09/03/13 0352 104/52 mmHg 97.8 F (36.6 C) Oral 71 12 99 % - -  09/03/13 0324 - - - - 14 100 % - -  09/03/13 0031 102/48 mmHg 97.5 F (36.4 C) Oral 71 12 100 % - -  09/03/13 0000 - - - - 12 100 % - -  09/02/13 2039 106/54 mmHg 97.3 F (36.3 C) Oral 62 8 100 % - -  09/02/13 2000 - - - - 12 98 % - -  09/02/13 1533 117/55 mmHg 97.4 F (36.3 C) Oral 56 13 97 % - -  09/02/13 1518 - 97.4 F (36.3 C) - - - - - -  09/02/13 1511 109/61 mmHg - - 57 10 100 % - -  09/02/13 1500 - - - 57 9 100 % - -  09/02/13 1445 - - - 56 11 100 % - -  09/02/13 1441 106/54 mmHg - - 55 10 100 % - -  09/02/13 1430 - 97.6 F (36.4 C) - 54 8 100 % - -  09/02/13 1426 113/49 mmHg - - 52 10 100 % - -  09/02/13 1415 - - - 52 11 100 % - -  09/02/13 1411 100/49 mmHg - - 54 10 100 % - -  09/02/13 1400 - - - 57 9 100 % - -  09/02/13  1356 102/54 mmHg - - 55 7 100 % - -  09/02/13 1345 - - - 58 11 100 % - -  09/02/13 1341 112/54 mmHg - - 59 10 100 % - -  09/02/13 1330 103/67 mmHg - - 58 9 100 % - -  09/02/13 1326 103/67 mmHg - - 61 10 100 % - -  09/02/13 1317 111/52 mmHg - - 64 10 100 % - -  09/02/13 1315 103/42 mmHg - - 62 13 100 % - -  09/02/13 1310 103/42 mmHg 97.7 F (36.5 C) - 68 16 100 % - -  09/02/13 7829 - - - - - - 5\' 3"  (1.6 m) 67.586 kg (149 lb)  09/02/13 0756 144/73 mmHg 97.7 F (36.5 C) Oral 64 20 100 % - 67.586 kg (149 lb)     Recent laboratory studies:   Recent Labs  09/03/13 0400  WBC 10.8*  HGB 9.7*  HCT 28.7*  PLT 156  NA 140  K 4.5  CL 103  CO2 22  BUN 17  CREATININE 0.79  GLUCOSE 135*  CALCIUM 8.8     Discharge Medications:     Medication List    STOP taking these medications       Fish Oil 1000 MG Caps     ibuprofen 200 MG tablet  Commonly known as:  ADVIL,MOTRIN     TYLENOL PO      TAKE these medications       aspirin EC 325  MG tablet  Take 1 tablet (325 mg total) by mouth daily.     bisacodyl 5 MG EC tablet  Commonly known as:  DULCOLAX  Take 1 tablet (5 mg total) by mouth daily as needed for moderate constipation.     HYDROmorphone 2 MG tablet  Commonly known as:  DILAUDID  Take 1 tablet (2 mg total) by mouth every 4 (four) hours as needed for severe pain.     methocarbamol 500 MG tablet  Commonly known as:  ROBAXIN  Take 1 tablet (500 mg total) by mouth 4 (four) times daily.     multivitamin with minerals Tabs tablet  Take 1 tablet by mouth daily.     ondansetron 4 MG tablet  Commonly known as:  ZOFRAN  Take 1 tablet (4 mg total) by mouth every 8 (eight) hours as needed for nausea or vomiting.     SYSTANE OP  Place 2 drops into the left eye 2 (two) times daily as needed. For dry eyes        Diagnostic Studies: Dg Chest 2 View  08/24/2013   CLINICAL DATA:  68 year old female preoperative study for hip surgery. Initial encounter.  EXAM: CHEST  2 VIEW  COMPARISON:  03/09/2011.  FINDINGS: Improved lung volumes and bibasilar ventilation. No pneumothorax, pulmonary edema, pleural effusion or confluent pulmonary opacity. Mild chronic increased interstitial markings. Normal cardiac size and mediastinal contours. Visualized tracheal air column is within normal limits. No acute osseous abnormality identified.  IMPRESSION: No acute cardiopulmonary abnormality.   Electronically Signed   By: Lars Pinks M.D.   On: 08/24/2013 11:53   Dg Pelvis Portable  09/02/2013   CLINICAL DATA:  Status post right hip arthroplasty.  EXAM: PORTABLE PELVIS 1-2 VIEWS  COMPARISON:  None  FINDINGS: Frontal projection of the pelvis shows normal alignment of a right hip arthroplasty. Previously placed left hip arthroplasty is normally aligned. No fracture.  IMPRESSION: Normal alignment of right hip arthroplasty.   Electronically Signed  By: Aletta Edouard M.D.   On: 09/02/2013 13:44   Dg Hip Portable 1 View Right  09/02/2013   CLINICAL  DATA:  Status post right hip arthroplasty.  EXAM: PORTABLE RIGHT HIP - 1 VIEW  COMPARISON:  Pelvic film today.  FINDINGS: Crosstable lateral view shows normal alignment of the right hip arthroplasty including the femoral stem. No fracture is identified.  IMPRESSION: Normal alignment of right hip arthroplasty in the lateral projection.   Electronically Signed   By: Aletta Edouard M.D.   On: 09/02/2013 13:45    Disposition: 01-Home or Self Care  Discharge Instructions   Call MD / Call 911    Complete by:  As directed   If you experience chest pain or shortness of breath, CALL 911 and be transported to the hospital emergency room.  If you develope a fever above 101 F, pus (white drainage) or increased drainage or redness at the wound, or calf pain, call your surgeon's office.     Change dressing    Complete by:  As directed   Change the dressing daily with sterile 4 x 4 inch gauze dressing and paper tape.  You may clean the incision with alcohol prior to redressing     Constipation Prevention    Complete by:  As directed   Drink plenty of fluids.  Prune juice may be helpful.  You may use a stool softener, such as Colace (over the counter) 100 mg twice a day.  Use MiraLax (over the counter) for constipation as needed.     Diet - low sodium heart healthy    Complete by:  As directed      Discharge instructions    Complete by:  As directed   Weight bearing as tolerated.  Take Aspirin 1 tab a day for the next 30 days to prevent blood clots.  Change dressing daily starting on Saturday.  May shower on Monday, but do not soak incision.  May apply ice for up to 20 minutes at a time for pain and swelling.  Follow up appointment in two weeks.     Do not put a pillow under the knee. Place it under the heel.    Complete by:  As directed   Place gray foam under operative heel when in bed or in a chair to work on extension     Follow the hip precautions as taught in Physical Therapy    Complete by:  As  directed   Posterior total hip precautions.  Weight bearing as tolerated     Increase activity slowly as tolerated    Complete by:  As directed      TED hose    Complete by:  As directed   Use stockings (TED hose) for two weeks on both leg(s).  You may remove them at night for sleeping.           Follow-up Information   Follow up with [facility], MD. Schedule an appointment as soon as possible for a visit in 2 weeks.   Specialty:  Orthopedic Surgery   Contact information:   [address] [place] [address] [phone]        Signed: Larae Grooms 09/03/2013, 7:19 AM

## 2013-09-03 LAB — CBC
HEMATOCRIT: 28.7 % — AB (ref 36.0–46.0)
Hemoglobin: 9.7 g/dL — ABNORMAL LOW (ref 12.0–15.0)
MCH: 30.3 pg (ref 26.0–34.0)
MCHC: 33.8 g/dL (ref 30.0–36.0)
MCV: 89.7 fL (ref 78.0–100.0)
PLATELETS: 156 10*3/uL (ref 150–400)
RBC: 3.2 MIL/uL — ABNORMAL LOW (ref 3.87–5.11)
RDW: 13.6 % (ref 11.5–15.5)
WBC: 10.8 10*3/uL — ABNORMAL HIGH (ref 4.0–10.5)

## 2013-09-03 LAB — BASIC METABOLIC PANEL
Anion gap: 15 (ref 5–15)
BUN: 17 mg/dL (ref 6–23)
CHLORIDE: 103 meq/L (ref 96–112)
CO2: 22 mEq/L (ref 19–32)
CREATININE: 0.79 mg/dL (ref 0.50–1.10)
Calcium: 8.8 mg/dL (ref 8.4–10.5)
GFR calc Af Amer: >90 mL/min (ref >90)
GFR calc non Af Amer: 82 mL/min — ABNORMAL LOW (ref >90)
Glucose, Bld: 135 mg/dL — ABNORMAL HIGH (ref 70–99)
Potassium: 4.5 mEq/L (ref 3.7–5.3)
Sodium: 140 mEq/L (ref 137–147)

## 2013-09-03 NOTE — Evaluation (Signed)
Occupational Therapy Evaluation and Discharge Patient Details Name: Lauren Montgomery MRN: [id number] DOB: 03-07-1945 Today's Date: 09/03/2013    History of Present Illness Anterior right total hip   Clinical Impression   This 68 yo female admitted and underwent above presents to acute OT with all education completed, will D/C from acute OT.    Follow Up Recommendations  No OT follow up    Equipment Recommendations  None recommended by OT       Precautions / Restrictions Precautions Precautions: Anterior Hip Precaution Booklet Issued: Yes (comment) Precaution Comments: reviewed handout with pt; no further questions; pt able to recall precautions independently  Restrictions Weight Bearing Restrictions: No Other Position/Activity Restrictions: WBAT      Mobility Bed Mobility               General bed mobility comments: Pt up in recliner upon my arrival  Transfers Overall transfer level: Modified independent Equipment used: Rolling walker (2 wheeled)             General transfer comment: Demonstrated safe hand placement    Balance Overall balance assessment: No apparent balance deficits (not formally assessed)         Standing balance support: During functional activity;No upper extremity supported Standing balance-Leahy Scale: Fair Standing balance comment: able to stand without UE support and no LOB noted; supervision for safety                            ADL                                         General ADL Comments: Reports that husband will A her prn for LB ADLs until she can do them for herself. I did go over with her how to get dressed the most efficiently energy wise. Practiced the tub transfer with tub bench with her with Min A for LLE-- she reports husband wiill A her with this               Pertinent Vitals/Pain No c/o pain     Hand Dominance Right   Extremity/Trunk Assessment Upper Extremity  Assessment Upper Extremity Assessment: Overall WFL for tasks assessed     Communication Communication Communication: No difficulties   Cognition Arousal/Alertness: Awake/alert Behavior During Therapy: WFL for tasks assessed/performed Overall Cognitive Status: Within Functional Limits for tasks assessed                                Home Living Family/patient expects to be discharged to:: Private residence Living Arrangements: Spouse/significant other Available Help at Discharge: Family;Available 24 hours/day Type of Home: House Home Access: Stairs to enter [employer] of Steps: 3 Entrance Stairs-Rails: Right Home Layout: One level     Bathroom Shower/Tub: Tub/shower unit;Curtain Shower/tub characteristics: Architectural technologist: Standard     Home Equipment: Environmental consultant - 2 wheels;Bedside commode;Cane - single point;Adaptive equipment;Tub bench;Grab bars - toilet Adaptive Equipment: [place]acher Additional Comments: pt had Lt THA in 2013      Prior Functioning/Environment Level of Independence: Independent                      OT Goals(Current goals can be found in the care plan section) Acute Rehab OT Goals Patient Stated Goal:  to go home today  OT Frequency:                End of Session Equipment Utilized During Treatment: Rolling walker  Activity Tolerance: Patient tolerated treatment well Patient left: in chair;with call bell/phone within reach   Time: 0854-0919 OT Time Calculation (min): 25 min Charges:  OT General Charges $OT Visit: 1 Procedure OT Evaluation $Initial OT Evaluation Tier I: 1 Procedure OT Treatments $Self Care/Home Management : 8-22 mins  Almon Register [phone] 09/03/2013, 11:42 AM

## 2013-09-03 NOTE — Op Note (Signed)
NAMETZIPPORAH, NAGORSKI NO.:  [id number]  MEDICAL RECORD NO.:  [id number]  LOCATION:  5N26C                        FACILITY:  [facility]  PHYSICIAN:  [name], M.D. DATE OF BIRTH:  07/04/1945  DATE OF PROCEDURE:  09/02/2013 DATE OF DISCHARGE:                              OPERATIVE REPORT   PREOPERATIVE DIAGNOSIS:  Right hip end-stage degenerative arthritis, very stiffed, marked loss of motion and erosive changes.  POSTOPERATIVE DIAGNOSIS:  Right hip end-stage degenerative arthritis, very stiffed, marked loss of motion and erosive changes.  PROCEDURE:  Anterior approach total hip replacement utilizing Stryker prosthesis.  A 48 mm acetabular component screw fixation x2.  A 36-mm internal diameter liner.  A press-fit #2 femoral stem, 127 degree neck angle with a 36, -2.5 BioLock head.  SURGEON:  [name], M.D.  ASSISTANT:  Doran Stabler PA-C, present throughout the entire case and necessary for timely completion of procedure.  ANESTHESIA:  General.  BLOOD LOSS:  400 mL.  BLOOD GIVEN:  None.  SPECIMENS:  None.  CULTURES:  None.  COMPLICATIONS:  At the time of removal of the femoral trial, the head became dislodged, migrated in a soft tissue into a submuscular position right lower abdomen, not entering the abdominal cavity.  This could be felt from the wound below, but not safely accessed.  I, therefore, had Dr. Rogers Seeds consulted, who is in the operating room.  We extended the incision proximally.  Under his care, he did a splitting incision in the muscle over the lower abdominal wall and we were then able to push the trial head out distally.  He dictated that part of the case.  DESCRIPTION OF PROCEDURE:  The patient was brought to the operating room and placed on the operating table in supine position.  After adequate anesthesia had been obtained, appropriate positioning and support for anterior hip placement.  Prepped and draped in usual  sterile fashion. Incision just posterior to the ASIS going distally.  Skin and subcutaneous tissue divided.  Fascia of the tensor identified and incised.  Hemostasis with cautery.  Retractor was put in place.  The deep fascia and capsule were excised exposing the hip.  The femoral neck was cut 1 fingerbreadth above the lesser trochanter with a napkin ring above that.  Napkin ring and head removed.  Acetabulum exposed.  Spurs removed.  Brought up to good bleeding bone.  Fitted with a 48 mm component and hammered in place at 40 degrees of abduction and slight anteversion.  Good capturing and fixation augmented with 2 screws through the cup.  The liner inserted.  Attention was turned to the femur.  Soft tissue was freed up to safely access this.  Appropriate trials  with broaches.  Sized with #2 component.  I then did a trial reduction with a -5 head.  This was a little bit short.  I elected to use the -2.5 head.  As I was dislocating the trial, the femoral head became dislodged, migrated in the soft tissue up over the ilioinguinal ligament into the submuscular position of right lower abdomen.  Although I could palpate this from the wound, I did not feel safe about trying  to probe and grabbed this blindly.  I therefore consulted Dr. Rogers Seeds. Knowing the situation, we extended the incision proximally.  Exposed the right lower abdominal wall.  He did a small muscle-splitting incision, could easily access that and then we could bring it out distally without damaging the end of the structures.  At the time of doing this, she did put a small rent in the peritoneal lining, but not damaging any of the structures.  That was repaired with the soft tissue repair by hand, but I proceeded with the case.  The definitive component hammered in place with the femur.  The -2.5 head attached.  Hip reduced.  Excellent stability with leg lengths.  Wound copiously irrigated.  Injected with Exparel.  Fascia closed  with 0 Vicryl, and then the subcutaneous subcuticular closure.  Margins were injected with Marcaine.  Sterile compressive dressing applied.  Anesthesia reversed.  Brought to the recovery room.  Tolerated the surgery well.  No complications as listed above.     [name], M.D.     DFM/MEDQ  D:  09/02/2013  T:  09/03/2013  Job:  [id number]

## 2013-09-03 NOTE — Care Management Note (Signed)
CARE MANAGEMENT NOTE 09/03/2013  Patient:  Lauren Montgomery, Lauren Montgomery   Account Number:  [id number]  Date Initiated:  09/03/2013  Documentation initiated by:  Ricki Miller  Subjective/Objective Assessment:   68 yr old female s/p right total hip arthroplasty.     Action/Plan:   Case manager spoke with patient and husband concerning home health and DME needs at discharge. Preoperatively setup with [facility], no changes. Has rolling walker and 3in1. Has family support at discharge.   Anticipated DC Date:  09/03/2013   Anticipated DC Plan:  [facility]  CM consult      [facility] Choice  HOME HEALTH   Choice offered to / List presented to:  C-1 Patient   DME arranged  NA        [facility] arranged  HH-2 PT      [place]   Status of service:  Completed, signed off Medicare Important Message given?  NA - LOS <3 / Initial given by admissions (If response is "NO", the following Medicare IM given date fields will be blank) Date Medicare IM given:   Medicare IM given by:   Date Additional Medicare IM given:   Additional Medicare IM given by:    Discharge Disposition:  [facility]  Per UR Regulation:  Reviewed for med. necessity/level of care/duration of stay

## 2013-09-03 NOTE — Progress Notes (Signed)
Subjective: 1 Day Post-Op Procedure(s) (LRB): TOTAL HIP ARTHROPLASTY ANTERIOR APPROACH: Disolacted trial head resection muscular abdominal inguinal ligament. (Right) Patient reports pain as 0 on 0-10 scale.  Patient admits to nausea/vomiting yesterday, but none today.  No lightheadedness/dizziness despite ABLA.  No flatus and no bm as of yet.  Patient is complaining of inability to move right leg.  Objective: Vital signs in last 24 hours: Temp:  [97.3 F (36.3 C)-97.8 F (36.6 C)] 97.8 F (36.6 C) (07/09 0352) Pulse Rate:  [52-71] 71 (07/09 0352) Resp:  [7-20] 12 (07/09 0352) BP: (100-144)/(42-73) 104/52 mmHg (07/09 0352) SpO2:  [97 %-100 %] 99 % (07/09 0352) Weight:  [67.586 kg (149 lb)] 67.586 kg (149 lb) (07/08 0812)  Intake/Output from previous day: 07/08 0701 - 07/09 0700 In: 2400 [I.V.:1900; IV Piggyback:500] Out: 9150 [Urine:1425; Blood:400] Intake/Output this shift: Total I/O In: -  Out: 925 [Urine:925]   Recent Labs  09/03/13 0400  HGB 9.7*    Recent Labs  09/03/13 0400  WBC 10.8*  RBC 3.20*  HCT 28.7*  PLT 156    Recent Labs  09/03/13 0400  NA 140  K 4.5  CL 103  CO2 22  BUN 17  CREATININE 0.79  GLUCOSE 135*  CALCIUM 8.8   No results found for this basename: LABPT, INR,  in the last 72 hours  Neurologically intact Neurovascular intact Sensation intact distally Intact pulses distally Dorsiflexion/Plantar flexion intact Compartment soft No drainage noted through dressing Negative homen's bilaterally Patient unable to move right lower extremity  Assessment/Plan: 1 Day Post-Op Procedure(s) (LRB): TOTAL HIP ARTHROPLASTY ANTERIOR APPROACH: Disolacted trial head resection muscular abdominal inguinal ligament. (Right) Advance diet Up with therapy D/C IV fluids Discharge home with home health [place], [place] 09/03/2013, 6:39 AM

## 2013-09-03 NOTE — Evaluation (Signed)
Physical Therapy Evaluation Patient Details Name: Lauren Montgomery MRN: [id number] DOB: 10/14/1945 Today's Date: 09/03/2013   History of Present Illness  Anterior right total hip  Clinical Impression  Pt is s/p Rt THA POD#1 resulting in the deficits listed below (see PT Problem List).   Pt at supervision level for mobility and min guard for stair management. Pt educated thoroughly on HEP and home safety setup. Pt is safe from mobility standpoint to D/C home with husband today. All questions answered.     Follow Up Recommendations Home health PT;Supervision/Assistance - 24 hour    Equipment Recommendations  None recommended by PT    Recommendations for Other Services       Precautions / Restrictions Precautions Precautions: Anterior Hip Precaution Booklet Issued: Yes (comment) Precaution Comments: reviewed handout with pt; no further questions; pt able to recall precautions independently  Restrictions Weight Bearing Restrictions: No Other Position/Activity Restrictions: WBAT      Mobility  Bed Mobility               General bed mobility comments: reviewed technique; pt denies any difficulty; in recliner and returned to recliner   Transfers Overall transfer level: Modified independent Equipment used: Rolling walker (2 wheeled)             General transfer comment: demo good technique with transfers   Ambulation/Gait Ambulation/Gait assistance: Supervision Ambulation Distance (Feet): 240 Feet (120 x 2) Assistive device: Rolling walker (2 wheeled) Gait Pattern/deviations: Step-through pattern;Decreased stride length;Trunk flexed;Narrow base of support Gait velocity: decreased; guarded due to pain  Gait velocity interpretation: Below normal speed for age/gender General Gait Details: min cues for gt sequencing to progress to step through and head up; pt at supervision level for safety only; no LOB noted  Stairs Stairs: Yes Stairs assistance: Min guard Stair  Management: One rail Right;Step to pattern;Forwards Number of Stairs: 2 General stair comments: min guard to steady; demo and verbal cues for sequencing for proper stair management   Wheelchair Mobility    Modified Rankin (Stroke Patients Only)       Balance Overall balance assessment: No apparent balance deficits (not formally assessed)         Standing balance support: During functional activity;No upper extremity supported Standing balance-Leahy Scale: Fair Standing balance comment: able to stand without UE support and no LOB noted; supervision for safety                             Pertinent Vitals/Pain 4/10; premedicated by [place] expects to be discharged to:: Private residence Living Arrangements: Spouse/significant other Available Help at Discharge: Family;Available 24 hours/day Type of Home: House Home Access: Stairs to enter Entrance Stairs-Rails: Right Entrance Stairs-Number of Steps: 3 Home Layout: One level Home Equipment: Walker - 2 wheels;Bedside commode;Cane - single point;Adaptive equipment;Tub bench;Grab bars - toilet Additional Comments: pt had Lt THA in 2013    Prior Function Level of Independence: Independent               Hand Dominance   Dominant Hand: Right    Extremity/Trunk Assessment   Upper Extremity Assessment: Defer to OT evaluation           Lower Extremity Assessment: LLE deficits/detail   LLE Deficits / Details: knee 3-/5; hip 2/5   Cervical / Trunk Assessment: Normal  Communication   Communication: No difficulties  Cognition Arousal/Alertness: Awake/alert Behavior During Therapy: [facility] for  tasks assessed/performed Overall Cognitive Status: Within Functional Limits for tasks assessed                      General Comments General comments (skin integrity, edema, etc.): reviewed HEP and precautions throughouly; reviewed home setup safety; no further questions      Exercises        Assessment/Plan    PT Assessment [place] does not need any further PT services;All further PT needs can be met in the next venue of care  PT Diagnosis Abnormality of gait;Generalized weakness;Acute pain   PT Problem List Decreased strength;Decreased range of motion;Decreased activity tolerance;Decreased mobility;Pain  PT Treatment Interventions     PT Goals (Current goals can be found in the Care Plan section) Acute Rehab PT Goals Patient Stated Goal: to go home today PT Goal Formulation: No goals set, d/c therapy    Frequency     Barriers to discharge        Co-evaluation               End of Session Equipment Utilized During Treatment: Gait belt Activity Tolerance: Patient tolerated treatment well Patient left: in chair;with call bell/phone within reach Nurse Communication: Mobility status         Time: 4709-6283 PT Time Calculation (min): 33 min   Charges:   PT Evaluation $Initial PT Evaluation Tier I: 1 Procedure PT Treatments $Gait Training: 8-22 mins $Therapeutic Activity: 8-22 mins   PT G CodesGustavus Bryant, [place] 09/03/2013, 10:37 AM

## 2013-09-04 ENCOUNTER — Encounter (HOSPITAL_COMMUNITY): Payer: Self-pay | Admitting: Orthopedic Surgery

## 2014-03-08 ENCOUNTER — Other Ambulatory Visit: Payer: Self-pay | Admitting: Physician Assistant

## 2014-03-08 NOTE — H&P (Signed)
TOTAL KNEE ADMISSION H&P  Patient is being admitted for right total knee arthroplasty.  Subjective:  Chief Complaint:right knee pain.  HPI: Lauren Montgomery, 69 y.o. female, has a history of pain and functional disability in the right knee due to arthritis and has failed non-surgical conservative treatments for greater than 12 weeks to includeNSAID's and/or analgesics, corticosteriod injections, viscosupplementation injections and activity modification.  Onset of symptoms was gradual, starting 5 years ago with gradually worsening course since that time. The patient noted no past surgery on the right knee(s).  Patient currently rates pain in the right knee(s) at 5 out of 10 with activity.  Patient has evidence of subchondral sclerosis and joint space narrowing by imaging studies. There is no active infection.  Patient Active Problem List   Diagnosis Date Noted  . Primary localized osteoarthrosis, pelvic region and thigh 09/02/2013   Past Medical History  Diagnosis Date  . Skin cancer     nose  . Blood transfusion     with childbirth  . Osteoarthritis   . Cataract     left eye  . Degenerative joint disease   . PONV (postoperative nausea and vomiting)     denies,   . Tuberculosis     mom passed from tb- raised in orphanage    Past Surgical History  Procedure Laterality Date  . Bone anchored hearing aid implant Left   . Carpal tunnel release      right hand  . Tubal ligation    . Bunionectomy Bilateral     feet  . Total hip arthroplasty  03/14/2011    Procedure: TOTAL HIP ARTHROPLASTY;  Surgeon: [name], MD;  Location: [place];  Service: Orthopedics;  Laterality: Left;  120 MINUTES FOR THIS SURGERY  . Total hip arthroplasty Right 09/02/2013    Procedure: TOTAL HIP ARTHROPLASTY ANTERIOR APPROACH: Disolacted trial head resection muscular abdominal inguinal ligament.;  Surgeon: [name], MD;  Location: [place];  Service: Orthopedics;  Laterality: Right;     (Not in a  hospital admission) Allergies  Allergen Reactions  . Codeine Nausea Only    History  Substance Use Topics  . Smoking status: Never Smoker   . Smokeless tobacco: Never Used  . Alcohol Use: No    Family History  Problem Relation Age of Onset  . Adopted: Yes     Review of Systems  Constitutional: Negative.   HENT: Positive for hearing loss. Negative for ear pain, nosebleeds and tinnitus.   Eyes: Negative.   Respiratory: Negative.   Cardiovascular: Negative.   Gastrointestinal: Negative.   Genitourinary: Negative.   Musculoskeletal: Positive for joint pain.  Skin: Negative.   Neurological: Positive for headaches. Negative for dizziness, tingling and sensory change.  Endo/Heme/Allergies: Negative.   Psychiatric/Behavioral: Negative.     Objective:  Physical Exam  Constitutional: She is oriented to person, place, and time. She appears well-developed and well-nourished.  HENT:  Head: Normocephalic and atraumatic.  Eyes: EOM are normal. Pupils are equal, round, and reactive to light.  Neck: Normal range of motion. Neck supple.  Cardiovascular: Normal rate and regular rhythm.   Respiratory: Effort normal. No respiratory distress. She has no wheezes. She has no rales.  GI: Soft. Bowel sounds are normal.  Musculoskeletal:  Specifically, well healed incision of her hip.  Negative log roll.  No pain.  Her femoral nerve is still 4-/5, but it is gradually improving.  Both knees varus thrust, right greater than left.  The left she  still has full extension, 100 degrees of flexion.  On the right she is 5-80 with another 5 degree lag.  Marked varus, grating and crepitus.  Neurovascularly intact distally.    Neurological: She is alert and oriented to person, place, and time.  Skin: Skin is warm and dry.  Psychiatric: She has a normal mood and affect. Her behavior is normal. Judgment and thought content normal.    Vital signs in last 24 hours: @VSRANGES @  Labs:   Estimated body mass  index is 26.42 kg/(m^2) as calculated from the following:   Height as of 09/02/13: 5\' 3"  (1.6 m).   Weight as of 08/24/13: 67.631 kg (149 lb 1.6 oz).   Imaging Review Plain radiographs demonstrate severe degenerative joint disease of the right knee(s). The overall alignment issignificant varus. The bone quality appears to be fair for age and reported activity level.  Assessment/Plan:  End stage arthritis, right knee   The patient history, physical examination, clinical judgment of the provider and imaging studies are consistent with end stage degenerative joint disease of the right knee(s) and total knee arthroplasty is deemed medically necessary. The treatment options including medical management, injection therapy arthroscopy and arthroplasty were discussed at length. The risks and benefits of total knee arthroplasty were presented and reviewed. The risks due to aseptic loosening, infection, stiffness, patella tracking problems, thromboembolic complications and other imponderables were discussed. The patient acknowledged the explanation, agreed to proceed with the plan and consent was signed. Patient is being admitted for inpatient treatment for surgery, pain control, PT, OT, prophylactic antibiotics, VTE prophylaxis, progressive ambulation and ADL's and discharge planning. The patient is planning to be discharged home with home health services

## 2014-03-11 ENCOUNTER — Encounter (HOSPITAL_COMMUNITY): Payer: Self-pay

## 2014-03-11 ENCOUNTER — Encounter (HOSPITAL_COMMUNITY)
Admission: RE | Admit: 2014-03-11 | Discharge: 2014-03-11 | Disposition: A | Discharge status: Home / Self Care | Payer: Medicare Other | Source: Ambulatory Visit | Attending: Orthopedic Surgery | Admitting: Orthopedic Surgery

## 2014-03-11 DIAGNOSIS — M1711 Unilateral primary osteoarthritis, right knee: Secondary | ICD-10-CM | POA: Insufficient documentation

## 2014-03-11 DIAGNOSIS — Z01818 Encounter for other preprocedural examination: Secondary | ICD-10-CM | POA: Insufficient documentation

## 2014-03-11 DIAGNOSIS — Z01812 Encounter for preprocedural laboratory examination: Secondary | ICD-10-CM | POA: Diagnosis not present

## 2014-03-11 LAB — COMPREHENSIVE METABOLIC PANEL
ALT: 17 U/L (ref 0–35)
AST: 23 U/L (ref 0–37)
Albumin: 4.1 g/dL (ref 3.5–5.2)
Alkaline Phosphatase: 102 U/L (ref 39–117)
Anion gap: 5 (ref 5–15)
BUN: 14 mg/dL (ref 6–23)
CHLORIDE: 106 meq/L (ref 96–112)
CO2: 31 mmol/L (ref 19–32)
Calcium: 9.8 mg/dL (ref 8.4–10.5)
Creatinine, Ser: 0.82 mg/dL (ref 0.50–1.10)
GFR calc non Af Amer: 71 mL/min — ABNORMAL LOW (ref >90)
GFR, EST AFRICAN AMERICAN: 82 mL/min — AB (ref >90)
Glucose, Bld: 108 mg/dL — ABNORMAL HIGH (ref 70–99)
Potassium: 4 mmol/L (ref 3.5–5.1)
Sodium: 142 mmol/L (ref 135–145)
TOTAL PROTEIN: 7.3 g/dL (ref 6.0–8.3)
Total Bilirubin: 0.7 mg/dL (ref 0.3–1.2)

## 2014-03-11 LAB — TYPE AND SCREEN
ABO/RH(D): B NEG
ANTIBODY SCREEN: NEGATIVE

## 2014-03-11 LAB — CBC WITH DIFFERENTIAL/PLATELET
BASOS PCT: 1 % (ref 0–1)
Basophils Absolute: 0.1 10*3/uL (ref 0.0–0.1)
EOS ABS: 0.2 10*3/uL (ref 0.0–0.7)
EOS PCT: 3 % (ref 0–5)
HCT: 41.8 % (ref 36.0–46.0)
Hemoglobin: 13.6 g/dL (ref 12.0–15.0)
Lymphocytes Relative: 29 % (ref 12–46)
Lymphs Abs: 1.9 10*3/uL (ref 0.7–4.0)
MCH: 29.2 pg (ref 26.0–34.0)
MCHC: 32.5 g/dL (ref 30.0–36.0)
MCV: 89.7 fL (ref 78.0–100.0)
Monocytes Absolute: 0.8 10*3/uL (ref 0.1–1.0)
Monocytes Relative: 12 % (ref 3–12)
Neutro Abs: 3.7 10*3/uL (ref 1.7–7.7)
Neutrophils Relative %: 55 % (ref 43–77)
PLATELETS: 248 10*3/uL (ref 150–400)
RBC: 4.66 MIL/uL (ref 3.87–5.11)
RDW: 14.9 % (ref 11.5–15.5)
WBC: 6.6 10*3/uL (ref 4.0–10.5)

## 2014-03-11 LAB — URINALYSIS, ROUTINE W REFLEX MICROSCOPIC
Bilirubin Urine: NEGATIVE
GLUCOSE, UA: NEGATIVE mg/dL
Hgb urine dipstick: NEGATIVE
Ketones, ur: NEGATIVE mg/dL
Nitrite: NEGATIVE
Protein, ur: NEGATIVE mg/dL
Specific Gravity, Urine: 1.008 (ref 1.005–1.030)
Urobilinogen, UA: 0.2 mg/dL (ref 0.0–1.0)
pH: 7 (ref 5.0–8.0)

## 2014-03-11 LAB — SURGICAL PCR SCREEN
MRSA, PCR: NEGATIVE
Staphylococcus aureus: NEGATIVE

## 2014-03-11 LAB — PROTIME-INR
INR: 1.01 (ref 0.00–1.49)
PROTHROMBIN TIME: 13.4 s (ref 11.6–15.2)

## 2014-03-11 LAB — URINE MICROSCOPIC-ADD ON

## 2014-03-11 LAB — APTT: aPTT: 27 seconds (ref 24–37)

## 2014-03-11 NOTE — Pre-Procedure Instructions (Signed)
Lauren Montgomery  03/11/2014   Your procedure is scheduled on:  03/24/14  Report to [facility] Admitting at 9 AM.  Call this number if you have problems the morning of surgery: [phone]   Remember:   Do not eat food or drink liquids after midnight.   Take these medicines the morning of surgery with A SIP OF WATER: dilaudid,zofran   Do not wear jewelry, make-up or nail polish.  Do not wear lotions, powders, or perfumes. You may wear deodorant.  Do not shave 48 hours prior to surgery. Men may shave face and neck.  Do not bring valuables to the hospital.  [facility] is not responsible                  for any belongings or valuables.               Contacts, dentures or bridgework may not be worn into surgery.  Leave suitcase in the car. After surgery it may be brought to your room.  For patients admitted to the hospital, discharge time is determined by your                treatment team.               Patients discharged the day of surgery will not be allowed to drive  home.  Name and phone number of your driver: famiy  Special Instructions: Shower using CHG 2 nights before surgery and the night before surgery.  If you shower the day of surgery use CHG.  Use special wash - you have one bottle of CHG for all showers.  You should use approximately 1/3 of the bottle for each shower.   Please read over the following fact sheets that you were given: Pain Booklet, Coughing and Deep Breathing, Blood Transfusion Information, MRSA Information and Surgical Site Infection Prevention

## 2014-03-23 MED ORDER — LACTATED RINGERS IV SOLN: INTRAVENOUS | Status: DC

## 2014-03-23 MED ORDER — CHLORHEXIDINE GLUCONATE 4 % EX LIQD
60.0000 mL | Freq: Once | CUTANEOUS | Status: DC
  Filled 2014-03-23: qty 60

## 2014-03-23 MED ORDER — CEFAZOLIN SODIUM-DEXTROSE 2-3 GM-% IV SOLR
2.0000 g | INTRAVENOUS | Status: AC
  Administered 2014-03-24: 2 g via INTRAVENOUS
  Filled 2014-03-23: qty 50

## 2014-03-23 NOTE — Progress Notes (Signed)
Spoke with pt and informed her of surgery time change and to arrive at 0830 with all other instructions remaining the same.  Pt states understanding.

## 2014-03-23 NOTE — Anesthesia Preprocedure Evaluation (Addendum)
Anesthesia Evaluation  Patient identified by MRN, date of birth, ID band Patient awake    Reviewed: Allergy & Precautions, NPO status , Patient's Chart, lab work & pertinent test results, reviewed documented beta blocker date and time   History of Anesthesia Complications (+) PONV  Airway Mallampati: II   Neck ROM: Full    Dental  (+) Teeth Intact, Dental Advisory Given   Pulmonary  breath sounds clear to auscultation        Cardiovascular negative cardio ROS  Rhythm:Regular     Neuro/Psych    GI/Hepatic negative GI ROS, Neg liver ROS,   Endo/Other  negative endocrine ROS  Renal/GU negative Renal ROS     Musculoskeletal   Abdominal (+)  Abdomen: soft.    Peds  Hematology 13/41 H/H   Anesthesia Other Findings   Reproductive/Obstetrics                            Anesthesia Physical Anesthesia Plan  ASA: II  Anesthesia Plan: General   Post-op Pain Management:    Induction: Intravenous  Airway Management Planned: Oral ETT  Additional Equipment:   Intra-op Plan:   Post-operative Plan: Extubation in OR  Informed Consent: I have reviewed the patients History and Physical, chart, labs and discussed the procedure including the risks, benefits and alternatives for the proposed anesthesia with the patient or authorized representative who has indicated his/her understanding and acceptance.     Plan Discussed with:   Anesthesia Plan Comments:         Anesthesia Quick Evaluation

## 2014-03-24 ENCOUNTER — Inpatient Hospital Stay (HOSPITAL_COMMUNITY)
Admission: RE | Admit: 2014-03-24 | Discharge: 2014-03-25 | DRG: 470 | Disposition: A | Discharge status: Home / Self Care | Payer: Medicare Other | Source: Ambulatory Visit | Attending: Orthopedic Surgery | Admitting: Orthopedic Surgery

## 2014-03-24 ENCOUNTER — Inpatient Hospital Stay (HOSPITAL_COMMUNITY): Payer: Medicare Other | Admitting: Anesthesiology

## 2014-03-24 ENCOUNTER — Encounter (HOSPITAL_COMMUNITY)
Admission: RE | Disposition: A | Discharge status: Home / Self Care | Payer: Self-pay | Source: Ambulatory Visit | Attending: Orthopedic Surgery

## 2014-03-24 ENCOUNTER — Inpatient Hospital Stay (HOSPITAL_COMMUNITY): Payer: Medicare Other

## 2014-03-24 ENCOUNTER — Encounter (HOSPITAL_COMMUNITY): Payer: Self-pay

## 2014-03-24 DIAGNOSIS — Z96659 Presence of unspecified artificial knee joint: Secondary | ICD-10-CM

## 2014-03-24 DIAGNOSIS — M25561 Pain in right knee: Secondary | ICD-10-CM | POA: Diagnosis present

## 2014-03-24 DIAGNOSIS — Z96643 Presence of artificial hip joint, bilateral: Secondary | ICD-10-CM | POA: Diagnosis present

## 2014-03-24 DIAGNOSIS — Z885 Allergy status to narcotic agent status: Secondary | ICD-10-CM

## 2014-03-24 DIAGNOSIS — H269 Unspecified cataract: Secondary | ICD-10-CM | POA: Diagnosis present

## 2014-03-24 DIAGNOSIS — M1711 Unilateral primary osteoarthritis, right knee: Principal | ICD-10-CM | POA: Diagnosis present

## 2014-03-24 DIAGNOSIS — Z9851 Tubal ligation status: Secondary | ICD-10-CM

## 2014-03-24 DIAGNOSIS — M179 Osteoarthritis of knee, unspecified: Secondary | ICD-10-CM | POA: Diagnosis present

## 2014-03-24 DIAGNOSIS — M1710 Unilateral primary osteoarthritis, unspecified knee: Secondary | ICD-10-CM | POA: Diagnosis present

## 2014-03-24 DIAGNOSIS — D62 Acute posthemorrhagic anemia: Secondary | ICD-10-CM | POA: Diagnosis not present

## 2014-03-24 DIAGNOSIS — Z85828 Personal history of other malignant neoplasm of skin: Secondary | ICD-10-CM | POA: Diagnosis not present

## 2014-03-24 HISTORY — PX: TOTAL KNEE ARTHROPLASTY: SHX125

## 2014-03-24 SURGERY — ARTHROPLASTY, KNEE, TOTAL
Anesthesia: General | Site: Knee | Laterality: Right

## 2014-03-24 MED ORDER — SUCCINYLCHOLINE CHLORIDE 20 MG/ML IJ SOLN
INTRAMUSCULAR | Status: AC
  Filled 2014-03-24: qty 1

## 2014-03-24 MED ORDER — DEXAMETHASONE SODIUM PHOSPHATE 10 MG/ML IJ SOLN
10.0000 mg | Freq: Once | INTRAMUSCULAR | Status: AC
  Administered 2014-03-25: 10 mg via INTRAVENOUS
  Filled 2014-03-24: qty 1

## 2014-03-24 MED ORDER — ONDANSETRON HCL 4 MG PO TABS: 4.0000 mg | ORAL_TABLET | Freq: Four times a day (QID) | ORAL | Status: DC | PRN

## 2014-03-24 MED ORDER — DEXAMETHASONE SODIUM PHOSPHATE 4 MG/ML IJ SOLN
INTRAMUSCULAR | Status: DC | PRN
  Administered 2014-03-24: 4 mg via INTRAVENOUS

## 2014-03-24 MED ORDER — BUPIVACAINE HCL 0.5 % IJ SOLN
INTRAMUSCULAR | Status: DC | PRN
  Administered 2014-03-24: 10 mL

## 2014-03-24 MED ORDER — FENTANYL CITRATE 0.05 MG/ML IJ SOLN
INTRAMUSCULAR | Status: AC
  Filled 2014-03-24: qty 5

## 2014-03-24 MED ORDER — PROPOFOL 10 MG/ML IV BOLUS
INTRAVENOUS | Status: AC
  Filled 2014-03-24: qty 20

## 2014-03-24 MED ORDER — ONDANSETRON HCL 4 MG/2ML IJ SOLN: 4.0000 mg | Freq: Four times a day (QID) | INTRAMUSCULAR | Status: DC | PRN

## 2014-03-24 MED ORDER — PROPOFOL 10 MG/ML IV BOLUS
INTRAVENOUS | Status: DC | PRN
  Administered 2014-03-24: 50 mg via INTRAVENOUS
  Administered 2014-03-24: 150 mg via INTRAVENOUS
  Administered 2014-03-24: 50 mg via INTRAVENOUS

## 2014-03-24 MED ORDER — ARTIFICIAL TEARS OP OINT
TOPICAL_OINTMENT | OPHTHALMIC | Status: AC
  Filled 2014-03-24: qty 3.5

## 2014-03-24 MED ORDER — CEFAZOLIN SODIUM 1-5 GM-% IV SOLN
1.0000 g | Freq: Four times a day (QID) | INTRAVENOUS | Status: AC
  Administered 2014-03-24 (×2): 1 g via INTRAVENOUS
  Filled 2014-03-24 (×2): qty 50

## 2014-03-24 MED ORDER — FENTANYL CITRATE 0.05 MG/ML IJ SOLN
25.0000 ug | INTRAMUSCULAR | Status: DC | PRN
  Administered 2014-03-24 (×2): 50 ug via INTRAVENOUS

## 2014-03-24 MED ORDER — SCOPOLAMINE 1 MG/3DAYS TD PT72
MEDICATED_PATCH | TRANSDERMAL | Status: DC | PRN
  Administered 2014-03-24: 1 via TRANSDERMAL

## 2014-03-24 MED ORDER — ONDANSETRON HCL 4 MG/2ML IJ SOLN
INTRAMUSCULAR | Status: DC | PRN
  Administered 2014-03-24: 4 mg via INTRAVENOUS

## 2014-03-24 MED ORDER — GLYCOPYRROLATE 0.2 MG/ML IJ SOLN
INTRAMUSCULAR | Status: AC
  Filled 2014-03-24: qty 2

## 2014-03-24 MED ORDER — MEPERIDINE HCL 25 MG/ML IJ SOLN: 6.2500 mg | INTRAMUSCULAR | Status: DC | PRN

## 2014-03-24 MED ORDER — STERILE WATER FOR INJECTION IJ SOLN
INTRAMUSCULAR | Status: AC
  Filled 2014-03-24: qty 10

## 2014-03-24 MED ORDER — APIXABAN 2.5 MG PO TABS
2.5000 mg | ORAL_TABLET | Freq: Two times a day (BID) | ORAL | Status: DC
  Administered 2014-03-25: 2.5 mg via ORAL
  Filled 2014-03-24 (×2): qty 1

## 2014-03-24 MED ORDER — NEOSTIGMINE METHYLSULFATE 10 MG/10ML IV SOLN
INTRAVENOUS | Status: AC
  Filled 2014-03-24: qty 1

## 2014-03-24 MED ORDER — ALUM & MAG HYDROXIDE-SIMETH 200-200-20 MG/5ML PO SUSP: 30.0000 mL | ORAL | Status: DC | PRN

## 2014-03-24 MED ORDER — LIDOCAINE HCL (CARDIAC) 20 MG/ML IV SOLN
INTRAVENOUS | Status: DC | PRN
  Administered 2014-03-24: 60 mg via INTRAVENOUS
  Administered 2014-03-24: 40 mg via INTRAVENOUS

## 2014-03-24 MED ORDER — SCOPOLAMINE 1 MG/3DAYS TD PT72
MEDICATED_PATCH | TRANSDERMAL | Status: AC
  Filled 2014-03-24: qty 2

## 2014-03-24 MED ORDER — DEXTROSE 5 % IV SOLN
500.0000 mg | Freq: Four times a day (QID) | INTRAVENOUS | Status: DC | PRN
  Filled 2014-03-24: qty 5

## 2014-03-24 MED ORDER — BUPIVACAINE LIPOSOME 1.3 % IJ SUSP
20.0000 mL | Freq: Once | INTRAMUSCULAR | Status: DC
  Filled 2014-03-24: qty 20

## 2014-03-24 MED ORDER — BISACODYL 5 MG PO TBEC: 5.0000 mg | DELAYED_RELEASE_TABLET | Freq: Every day | ORAL | Status: DC | PRN

## 2014-03-24 MED ORDER — ATORVASTATIN CALCIUM 10 MG PO TABS
10.0000 mg | ORAL_TABLET | Freq: Every day | ORAL | Status: DC
  Administered 2014-03-24: 10 mg via ORAL
  Filled 2014-03-24 (×2): qty 1

## 2014-03-24 MED ORDER — DIPHENHYDRAMINE HCL 12.5 MG/5ML PO ELIX: 12.5000 mg | ORAL_SOLUTION | ORAL | Status: DC | PRN

## 2014-03-24 MED ORDER — HYDROMORPHONE HCL 1 MG/ML IJ SOLN
0.5000 mg | INTRAMUSCULAR | Status: DC | PRN
  Administered 2014-03-24 – 2014-03-25 (×3): 1 mg via INTRAVENOUS
  Filled 2014-03-24 (×3): qty 1

## 2014-03-24 MED ORDER — DEXAMETHASONE SODIUM PHOSPHATE 4 MG/ML IJ SOLN
INTRAMUSCULAR | Status: AC
  Filled 2014-03-24: qty 1

## 2014-03-24 MED ORDER — MIDAZOLAM HCL 2 MG/2ML IJ SOLN
INTRAMUSCULAR | Status: AC
  Filled 2014-03-24: qty 2

## 2014-03-24 MED ORDER — ONDANSETRON HCL 4 MG PO TABS: 4.0000 mg | ORAL_TABLET | Freq: Three times a day (TID) | ORAL | Status: DC | PRN

## 2014-03-24 MED ORDER — BUPIVACAINE HCL (PF) 0.25 % IJ SOLN
INTRAMUSCULAR | Status: AC
  Filled 2014-03-24: qty 30

## 2014-03-24 MED ORDER — APIXABAN 2.5 MG PO TABS: ORAL_TABLET | ORAL | Status: DC

## 2014-03-24 MED ORDER — PHENYLEPHRINE HCL 10 MG/ML IJ SOLN
INTRAMUSCULAR | Status: DC | PRN
  Administered 2014-03-24: 40 ug via INTRAVENOUS

## 2014-03-24 MED ORDER — ONDANSETRON HCL 4 MG/2ML IJ SOLN
INTRAMUSCULAR | Status: AC
  Filled 2014-03-24: qty 2

## 2014-03-24 MED ORDER — METOCLOPRAMIDE HCL 10 MG PO TABS: 5.0000 mg | ORAL_TABLET | Freq: Three times a day (TID) | ORAL | Status: DC | PRN

## 2014-03-24 MED ORDER — FENTANYL CITRATE 0.05 MG/ML IJ SOLN
INTRAMUSCULAR | Status: DC | PRN
  Administered 2014-03-24 (×4): 25 ug via INTRAVENOUS
  Administered 2014-03-24: 50 ug via INTRAVENOUS
  Administered 2014-03-24 (×2): 25 ug via INTRAVENOUS
  Administered 2014-03-24: 50 ug via INTRAVENOUS

## 2014-03-24 MED ORDER — PHENOL 1.4 % MT LIQD: 1.0000 | OROMUCOSAL | Status: DC | PRN

## 2014-03-24 MED ORDER — FENTANYL CITRATE 0.05 MG/ML IJ SOLN
INTRAMUSCULAR | Status: AC
  Filled 2014-03-24: qty 2

## 2014-03-24 MED ORDER — METHOCARBAMOL 500 MG PO TABS
500.0000 mg | ORAL_TABLET | Freq: Four times a day (QID) | ORAL | Status: DC | PRN
  Administered 2014-03-24 – 2014-03-25 (×3): 500 mg via ORAL
  Filled 2014-03-24 (×3): qty 1

## 2014-03-24 MED ORDER — LIDOCAINE HCL (CARDIAC) 20 MG/ML IV SOLN
INTRAVENOUS | Status: AC
  Filled 2014-03-24: qty 5

## 2014-03-24 MED ORDER — EPHEDRINE SULFATE 50 MG/ML IJ SOLN
INTRAMUSCULAR | Status: DC | PRN
  Administered 2014-03-24: 10 mg via INTRAVENOUS

## 2014-03-24 MED ORDER — BUPIVACAINE HCL (PF) 0.5 % IJ SOLN
INTRAMUSCULAR | Status: AC
  Filled 2014-03-24: qty 10

## 2014-03-24 MED ORDER — METOCLOPRAMIDE HCL 5 MG/ML IJ SOLN: 5.0000 mg | Freq: Three times a day (TID) | INTRAMUSCULAR | Status: DC | PRN

## 2014-03-24 MED ORDER — BUPIVACAINE LIPOSOME 1.3 % IJ SUSP
INTRAMUSCULAR | Status: DC | PRN
  Administered 2014-03-24: 20 mL

## 2014-03-24 MED ORDER — ACETAMINOPHEN 325 MG PO TABS: 650.0000 mg | ORAL_TABLET | Freq: Four times a day (QID) | ORAL | Status: DC | PRN

## 2014-03-24 MED ORDER — POTASSIUM CHLORIDE IN NACL 20-0.9 MEQ/L-% IV SOLN
INTRAVENOUS | Status: DC
  Administered 2014-03-24 – 2014-03-25 (×2): via INTRAVENOUS
  Filled 2014-03-24 (×3): qty 1000

## 2014-03-24 MED ORDER — MENTHOL 3 MG MT LOZG: 1.0000 | LOZENGE | OROMUCOSAL | Status: DC | PRN

## 2014-03-24 MED ORDER — SODIUM CHLORIDE 0.9 % IR SOLN
Status: DC | PRN
  Administered 2014-03-24 (×2): 1000 mL

## 2014-03-24 MED ORDER — PROMETHAZINE HCL 25 MG/ML IJ SOLN: 6.2500 mg | INTRAMUSCULAR | Status: DC | PRN

## 2014-03-24 MED ORDER — DOCUSATE SODIUM 100 MG PO CAPS
100.0000 mg | ORAL_CAPSULE | Freq: Two times a day (BID) | ORAL | Status: DC
  Administered 2014-03-24 – 2014-03-25 (×2): 100 mg via ORAL
  Filled 2014-03-24 (×2): qty 1

## 2014-03-24 MED ORDER — METHOCARBAMOL 500 MG PO TABS: 500.0000 mg | ORAL_TABLET | Freq: Four times a day (QID) | ORAL | Status: DC

## 2014-03-24 MED ORDER — HYDROMORPHONE HCL 2 MG PO TABS
2.0000 mg | ORAL_TABLET | ORAL | Status: DC | PRN
Start: 2014-03-24 — End: 2015-09-27

## 2014-03-24 MED ORDER — ACETAMINOPHEN 650 MG RE SUPP: 650.0000 mg | Freq: Four times a day (QID) | RECTAL | Status: DC | PRN

## 2014-03-24 MED ORDER — LACTATED RINGERS IV SOLN
INTRAVENOUS | Status: DC
  Administered 2014-03-24 (×2): via INTRAVENOUS

## 2014-03-24 MED ORDER — ROCURONIUM BROMIDE 50 MG/5ML IV SOLN
INTRAVENOUS | Status: AC
  Filled 2014-03-24: qty 1

## 2014-03-24 MED ORDER — EPHEDRINE SULFATE 50 MG/ML IJ SOLN
INTRAMUSCULAR | Status: AC
  Filled 2014-03-24: qty 1

## 2014-03-24 MED ORDER — SODIUM CHLORIDE 0.9 % IJ SOLN
INTRAMUSCULAR | Status: DC | PRN
  Administered 2014-03-24: 40 mL

## 2014-03-24 MED ORDER — MIDAZOLAM HCL 5 MG/5ML IJ SOLN
INTRAMUSCULAR | Status: DC | PRN
  Administered 2014-03-24: 2 mg via INTRAVENOUS

## 2014-03-24 MED ORDER — MORPHINE SULFATE 15 MG PO TABS
15.0000 mg | ORAL_TABLET | ORAL | Status: DC | PRN
  Administered 2014-03-25: 15 mg via ORAL
  Filled 2014-03-24: qty 1

## 2014-03-24 MED ORDER — ZOLPIDEM TARTRATE 5 MG PO TABS: 5.0000 mg | ORAL_TABLET | Freq: Every evening | ORAL | Status: DC | PRN

## 2014-03-24 MED ORDER — CELECOXIB 200 MG PO CAPS
200.0000 mg | ORAL_CAPSULE | Freq: Two times a day (BID) | ORAL | Status: DC
  Administered 2014-03-24 – 2014-03-25 (×2): 200 mg via ORAL
  Filled 2014-03-24 (×3): qty 1

## 2014-03-24 SURGICAL SUPPLY — 75 items
APL SKNCLS STERI-STRIP NONHPOA (GAUZE/BANDAGES/DRESSINGS) ×1
BANDAGE ELASTIC 4 VELCRO ST LF (GAUZE/BANDAGES/DRESSINGS) ×3 IMPLANT
BANDAGE ELASTIC 6 VELCRO ST LF (GAUZE/BANDAGES/DRESSINGS) ×3 IMPLANT
BANDAGE ESMARK 6X9 LF (GAUZE/BANDAGES/DRESSINGS) ×1 IMPLANT
BENZOIN TINCTURE PRP APPL 2/3 (GAUZE/BANDAGES/DRESSINGS) ×3 IMPLANT
BLADE SAG 18X100X1.27 (BLADE) ×6 IMPLANT
BNDG CMPR 9X6 STRL LF SNTH (GAUZE/BANDAGES/DRESSINGS) ×1
BNDG ESMARK 6X9 LF (GAUZE/BANDAGES/DRESSINGS) ×3
BOWL SMART MIX CTS (DISPOSABLE) ×3 IMPLANT
CAPT KNEE TOTAL 3 ×2 IMPLANT
CEMENT BONE SIMPLEX SPEEDSET (Cement) ×6 IMPLANT
CLOSURE WOUND 1/2 X4 (GAUZE/BANDAGES/DRESSINGS) ×1
COVER SURGICAL LIGHT HANDLE (MISCELLANEOUS) ×3 IMPLANT
CUFF TOURNIQUET SINGLE 34IN LL (TOURNIQUET CUFF) ×3 IMPLANT
DRAPE EXTREMITY T 121X128X90 (DRAPE) ×3 IMPLANT
DRAPE IMP U-DRAPE 54X76 (DRAPES) ×1 IMPLANT
DRAPE PROXIMA HALF (DRAPES) ×3 IMPLANT
DRAPE U-SHAPE 47X51 STRL (DRAPES) ×3 IMPLANT
DRSG PAD ABDOMINAL 8X10 ST (GAUZE/BANDAGES/DRESSINGS) ×3 IMPLANT
DURAPREP 26ML APPLICATOR (WOUND CARE) ×6 IMPLANT
ELECT CAUTERY BLADE 6.4 (BLADE) ×3 IMPLANT
ELECT REM PT RETURN 9FT ADLT (ELECTROSURGICAL) ×3
ELECTRODE REM PT RTRN 9FT ADLT (ELECTROSURGICAL) ×1 IMPLANT
EVACUATOR 1/8 PVC DRAIN (DRAIN) ×3 IMPLANT
FACESHIELD WRAPAROUND (MASK) ×6 IMPLANT
FACESHIELD WRAPAROUND OR TEAM (MASK) ×2 IMPLANT
GAUZE SPONGE 4X4 12PLY STRL (GAUZE/BANDAGES/DRESSINGS) ×3 IMPLANT
GLOVE BIOGEL PI IND STRL 6.5 (GLOVE) IMPLANT
GLOVE BIOGEL PI IND STRL 7.0 (GLOVE) ×2 IMPLANT
GLOVE BIOGEL PI INDICATOR 6.5 (GLOVE) ×6
GLOVE BIOGEL PI INDICATOR 7.0 (GLOVE) ×4
GLOVE ECLIPSE 6.5 STRL STRAW (GLOVE) ×6 IMPLANT
GLOVE ORTHO TXT STRL SZ7.5 (GLOVE) ×3 IMPLANT
GLOVE SURG SS PI 6.0 STRL IVOR (GLOVE) ×2 IMPLANT
GLOVE SURG SS PI 8.0 STRL IVOR (GLOVE) ×4 IMPLANT
GOWN STRL REUS W/ TWL LRG LVL3 (GOWN DISPOSABLE) ×1 IMPLANT
GOWN STRL REUS W/ TWL XL LVL3 (GOWN DISPOSABLE) ×1 IMPLANT
GOWN STRL REUS W/TWL 2XL LVL3 (GOWN DISPOSABLE) ×2 IMPLANT
GOWN STRL REUS W/TWL LRG LVL3 (GOWN DISPOSABLE) ×6
GOWN STRL REUS W/TWL XL LVL3 (GOWN DISPOSABLE) ×3
HANDPIECE INTERPULSE COAX TIP (DISPOSABLE) ×3
IMMOBILIZER KNEE 20 (SOFTGOODS) ×3
IMMOBILIZER KNEE 20 THIGH 36 (SOFTGOODS) IMPLANT
IMMOBILIZER KNEE 22 (SOFTGOODS) ×2 IMPLANT
IMMOBILIZER KNEE 22 UNIV (SOFTGOODS) ×3 IMPLANT
IMMOBILIZER KNEE 24 THIGH 36 (MISCELLANEOUS) IMPLANT
IMMOBILIZER KNEE 24 UNIV (MISCELLANEOUS)
KIT BASIN OR (CUSTOM PROCEDURE TRAY) ×3 IMPLANT
KIT ROOM TURNOVER OR (KITS) ×3 IMPLANT
MANIFOLD NEPTUNE II (INSTRUMENTS) ×3 IMPLANT
NDL 18GX1X1/2 (RX/OR ONLY) (NEEDLE) ×1 IMPLANT
NDL HYPO 25GX1X1/2 BEV (NEEDLE) ×1 IMPLANT
NEEDLE 18GX1X1/2 (RX/OR ONLY) (NEEDLE) ×3 IMPLANT
NEEDLE HYPO 25GX1X1/2 BEV (NEEDLE) ×3 IMPLANT
NS IRRIG 1000ML POUR BTL (IV SOLUTION) ×3 IMPLANT
PACK TOTAL JOINT (CUSTOM PROCEDURE TRAY) ×3 IMPLANT
PACK UNIVERSAL I (CUSTOM PROCEDURE TRAY) ×3 IMPLANT
PAD ARMBOARD 7.5X6 YLW CONV (MISCELLANEOUS) ×6 IMPLANT
PAD CAST 4YDX4 CTTN HI CHSV (CAST SUPPLIES) ×1 IMPLANT
PADDING CAST COTTON 4X4 STRL (CAST SUPPLIES) ×3
PADDING CAST COTTON 6X4 STRL (CAST SUPPLIES) ×3 IMPLANT
SET HNDPC FAN SPRY TIP SCT (DISPOSABLE) ×1 IMPLANT
STRIP CLOSURE SKIN 1/2X4 (GAUZE/BANDAGES/DRESSINGS) ×3 IMPLANT
SUCTION FRAZIER TIP 10 FR DISP (SUCTIONS) ×3 IMPLANT
SUT MNCRL AB 4-0 PS2 18 (SUTURE) ×3 IMPLANT
SUT VIC AB 0 CT1 27 (SUTURE) ×6
SUT VIC AB 0 CT1 27XBRD ANBCTR (SUTURE) IMPLANT
SUT VIC AB 1 CT1 27 (SUTURE) ×6
SUT VIC AB 1 CT1 27XBRD ANBCTR (SUTURE) ×2 IMPLANT
SUT VIC AB 2-0 CT1 27 (SUTURE) ×6
SUT VIC AB 2-0 CT1 TAPERPNT 27 (SUTURE) ×2 IMPLANT
SYR 50ML LL SCALE MARK (SYRINGE) ×3 IMPLANT
SYR CONTROL 10ML LL (SYRINGE) ×3 IMPLANT
TOWEL OR 17X24 6PK STRL BLUE (TOWEL DISPOSABLE) ×3 IMPLANT
TOWEL OR 17X26 10 PK STRL BLUE (TOWEL DISPOSABLE) ×3 IMPLANT

## 2014-03-24 NOTE — H&P (View-Only) (Signed)
TOTAL KNEE ADMISSION H&P  Patient is being admitted for right total knee arthroplasty.  Subjective:  Chief Complaint:right knee pain.  HPI: Lauren Montgomery, 69 y.o. female, has a history of pain and functional disability in the right knee due to arthritis and has failed non-surgical conservative treatments for greater than 12 weeks to includeNSAID's and/or analgesics, corticosteriod injections, viscosupplementation injections and activity modification.  Onset of symptoms was gradual, starting 5 years ago with gradually worsening course since that time. The patient noted no past surgery on the right knee(s).  Patient currently rates pain in the right knee(s) at 5 out of 10 with activity.  Patient has evidence of subchondral sclerosis and joint space narrowing by imaging studies. There is no active infection.  Patient Active Problem List   Diagnosis Date Noted  . Primary localized osteoarthrosis, pelvic region and thigh 09/02/2013   Past Medical History  Diagnosis Date  . Skin cancer     nose  . Blood transfusion     with childbirth  . Osteoarthritis   . Cataract     left eye  . Degenerative joint disease   . PONV (postoperative nausea and vomiting)     denies,   . Tuberculosis     mom passed from tb- raised in orphanage    Past Surgical History  Procedure Laterality Date  . Bone anchored hearing aid implant Left   . Carpal tunnel release      right hand  . Tubal ligation    . Bunionectomy Bilateral     feet  . Total hip arthroplasty  03/14/2011    Procedure: TOTAL HIP ARTHROPLASTY;  Surgeon: [name], MD;  Location: [place];  Service: Orthopedics;  Laterality: Left;  120 MINUTES FOR THIS SURGERY  . Total hip arthroplasty Right 09/02/2013    Procedure: TOTAL HIP ARTHROPLASTY ANTERIOR APPROACH: Disolacted trial head resection muscular abdominal inguinal ligament.;  Surgeon: [name], MD;  Location: [place];  Service: Orthopedics;  Laterality: Right;     (Not in a  hospital admission) Allergies  Allergen Reactions  . Codeine Nausea Only    History  Substance Use Topics  . Smoking status: Never Smoker   . Smokeless tobacco: Never Used  . Alcohol Use: No    Family History  Problem Relation Age of Onset  . Adopted: Yes     Review of Systems  Constitutional: Negative.   HENT: Positive for hearing loss. Negative for ear pain, nosebleeds and tinnitus.   Eyes: Negative.   Respiratory: Negative.   Cardiovascular: Negative.   Gastrointestinal: Negative.   Genitourinary: Negative.   Musculoskeletal: Positive for joint pain.  Skin: Negative.   Neurological: Positive for headaches. Negative for dizziness, tingling and sensory change.  Endo/Heme/Allergies: Negative.   Psychiatric/Behavioral: Negative.     Objective:  Physical Exam  Constitutional: She is oriented to person, place, and time. She appears well-developed and well-nourished.  HENT:  Head: Normocephalic and atraumatic.  Eyes: EOM are normal. Pupils are equal, round, and reactive to light.  Neck: Normal range of motion. Neck supple.  Cardiovascular: Normal rate and regular rhythm.   Respiratory: Effort normal. No respiratory distress. She has no wheezes. She has no rales.  GI: Soft. Bowel sounds are normal.  Musculoskeletal:  Specifically, well healed incision of her hip.  Negative log roll.  No pain.  Her femoral nerve is still 4-/5, but it is gradually improving.  Both knees varus thrust, right greater than left.  The left she  still has full extension, 100 degrees of flexion.  On the right she is 5-80 with another 5 degree lag.  Marked varus, grating and crepitus.  Neurovascularly intact distally.    Neurological: She is alert and oriented to person, place, and time.  Skin: Skin is warm and dry.  Psychiatric: She has a normal mood and affect. Her behavior is normal. Judgment and thought content normal.    Vital signs in last 24 hours: @VSRANGES @  Labs:   Estimated body mass  index is 26.42 kg/(m^2) as calculated from the following:   Height as of 09/02/13: 5\' 3"  (1.6 m).   Weight as of 08/24/13: 67.631 kg (149 lb 1.6 oz).   Imaging Review Plain radiographs demonstrate severe degenerative joint disease of the right knee(s). The overall alignment issignificant varus. The bone quality appears to be fair for age and reported activity level.  Assessment/Plan:  End stage arthritis, right knee   The patient history, physical examination, clinical judgment of the provider and imaging studies are consistent with end stage degenerative joint disease of the right knee(s) and total knee arthroplasty is deemed medically necessary. The treatment options including medical management, injection therapy arthroscopy and arthroplasty were discussed at length. The risks and benefits of total knee arthroplasty were presented and reviewed. The risks due to aseptic loosening, infection, stiffness, patella tracking problems, thromboembolic complications and other imponderables were discussed. The patient acknowledged the explanation, agreed to proceed with the plan and consent was signed. Patient is being admitted for inpatient treatment for surgery, pain control, PT, OT, prophylactic antibiotics, VTE prophylaxis, progressive ambulation and ADL's and discharge planning. The patient is planning to be discharged home with home health services

## 2014-03-24 NOTE — Discharge Summary (Addendum)
Patient ID: DELYNDA Montgomery MRN: [id number] DOB/AGE: [birth year] 69 y.o.  Admit date: 03/24/2014 Discharge date: 03/25/2014  Admission Diagnoses:  Active Problems:   DJD (degenerative joint disease) of knee   Discharge Diagnoses:  Same  Past Medical History  Diagnosis Date  . Skin cancer     nose  . Blood transfusion     with childbirth  . Osteoarthritis   . Cataract     left eye  . Degenerative joint disease   . PONV (postoperative nausea and vomiting)     denies,     Surgeries: Procedure(s): RIGHT TOTAL KNEE ARTHROPLASTY on 03/24/2014   Consultants:    Discharged Condition: Improved  Hospital Course: Lauren Montgomery is an 69 y.o. female who was admitted 03/24/2014 for operative treatment of primary osteoarthritis right knee. Patient has severe unremitting pain that affects sleep, daily activities, and work/hobbies. After pre-op clearance the patient was taken to the operating room on 03/24/2014 and underwent  Procedure(s): RIGHT TOTAL KNEE ARTHROPLASTY.  Patient with a pre-op Hb of 13.6 developed ABLA on pod#1 with a Hb of 9.6.  [name] is asymptomatic but we will continue to follow her.  Patient was given perioperative antibiotics:      Anti-infectives    Start     Dose/Rate Route Frequency Ordered Stop   03/24/14 1600  ceFAZolin (ANCEF) IVPB 1 g/50 mL premix     1 g100 mL/hr over 30 Minutes Intravenous Every 6 hours 03/24/14 1558 03/25/14 0000   03/24/14 0600  ceFAZolin (ANCEF) IVPB 2 g/50 mL premix     2 g100 mL/hr over 30 Minutes Intravenous On call to O.R. 03/23/14 1231 03/24/14 1100       Patient was given sequential compression devices, early ambulation, and chemoprophylaxis to prevent DVT.  Patient benefited maximally from hospital stay and there were no complications.    Recent vital signs:  Patient Vitals for the past 24 hrs:  BP Temp Temp src Pulse Resp SpO2 Height Weight  03/25/14 0534 (!) 98/54 mmHg 98.6 F (37 C) Oral 66 - 97 % - -  03/25/14 0000 -  - - - 16 98 % - -  03/24/14 2055 91/67 mmHg 97.8 F (36.6 C) Oral 85 - 97 % - -  03/24/14 1925 114/60 mmHg (!) 96.3 F (35.7 C) Axillary 65 - 96 % - -  03/24/14 1618 - - - - - - 5\' 2"  (1.575 m) 67.5 kg (148 lb 13 oz)  03/24/14 1610 - - - - - - 5\' 2"  (1.575 m) 67.5 kg (148 lb 13 oz)  03/24/14 1552 (!) 144/65 mmHg 97.5 F (36.4 C) - (!) 57 15 100 % - -  03/24/14 1515 (!) 137/46 mmHg - - 64 10 100 % - -  03/24/14 1500 (!) 146/66 mmHg - - (!) 59 11 100 % - -  03/24/14 1445 136/65 mmHg 97.6 F (36.4 C) - 65 10 100 % - -  03/24/14 1430 131/72 mmHg - - 60 (!) 8 100 % - -  03/24/14 1415 138/63 mmHg - - (!) 56 11 100 % - -  03/24/14 1400 (!) 139/115 mmHg - - 67 16 98 % - -  03/24/14 1345 135/68 mmHg - - 62 (!) 6 100 % - -  03/24/14 1330 140/67 mmHg - - 62 (!) 9 100 % - -  03/24/14 1315 133/67 mmHg - - 60 (!) 8 92 % - -  03/24/14 1300 135/71 mmHg - - 65 11 98 % - -  03/24/14 1245 136/80 mmHg - - 71 12 96 % - -  03/24/14 1230 131/66 mmHg 97.4 F (36.3 C) - 72 16 95 % - -     Recent laboratory studies:   Recent Labs  03/25/14 0855  WBC 10.3  HGB 9.6*  HCT 28.8*  PLT 158  NA 141  K 4.1  CL 109  CO2 26  BUN 18  CREATININE 0.97  GLUCOSE 114*  CALCIUM 8.4     Discharge Medications:     Medication List    STOP taking these medications        aspirin EC 325 MG tablet      TAKE these medications        apixaban 2.5 MG Tabs tablet  Commonly known as:  ELIQUIS  Take 1 tab PO BID x 12 days following surgery to prevent blood clots     atorvastatin 10 MG tablet  Commonly known as:  LIPITOR  Take 10 mg by mouth daily.     bisacodyl 5 MG EC tablet  Commonly known as:  DULCOLAX  Take 1 tablet (5 mg total) by mouth daily as needed for moderate constipation.     HYDROmorphone 2 MG tablet  Commonly known as:  DILAUDID  Take 1 tablet (2 mg total) by mouth every 4 (four) hours as needed for severe pain.     methocarbamol 500 MG tablet  Commonly known as:  ROBAXIN  Take 1  tablet (500 mg total) by mouth 4 (four) times daily.     ondansetron 4 MG tablet  Commonly known as:  ZOFRAN  Take 1 tablet (4 mg total) by mouth every 8 (eight) hours as needed for nausea or vomiting.        Diagnostic Studies: Dg Knee Right Port  03/24/2014   CLINICAL DATA:  Right total knee replacement  EXAM: PORTABLE RIGHT KNEE - 1-2 VIEW  COMPARISON:  None.  FINDINGS: The right knee demonstrates a total knee arthroplasty without evidence of hardware failure complication. There is no significant joint effusion. There is no fracture or dislocation. The alignment is anatomic. Surgical drains are present. Post-surgical changes noted in the surrounding soft tissues.  IMPRESSION: Right total knee arthroplasty.   Electronically Signed   By: Kathreen Devoid   On: 03/24/2014 15:29    Disposition: 06-Home-Health Care Svc  Discharge Instructions    CPM    Complete by:  As directed   Continuous passive motion machine (CPM):      Use the CPM from 0- to 60 for 6 hours per day.      You may increase by 10 per day.  You may break it up into 2 or 3 sessions per day.      Use CPM for 2-3 weeks or until you are told to stop.     Call MD / Call 911    Complete by:  As directed   If you experience chest pain or shortness of breath, CALL 911 and be transported to the hospital emergency room.  If you develope a fever above 101 F, pus (white drainage) or increased drainage or redness at the wound, or calf pain, call your surgeon's office.     Change dressing    Complete by:  As directed   Change dressing on Saturday, then change the dressing daily with sterile 4 x 4 inch gauze dressing and apply TED hose.  You may clean the incision with alcohol prior to redressing.  Constipation Prevention    Complete by:  As directed   Drink plenty of fluids.  Prune juice may be helpful.  You may use a stool softener, such as Colace (over the counter) 100 mg twice a day.  Use MiraLax (over the counter) for constipation  as needed.     Diet - low sodium heart healthy    Complete by:  As directed      Discharge instructions    Complete by:  As directed   Weight bearing as tolerated.  Take Eliquis as directed for a total of 12 days following surgery to prevent blood clots.  Change bandage daily starting on Saturday.  May shower on Monday, but do not soak incision.  May apply ice for up to 20 minutes at a time for pain and swelling.  Follow up appointment in two weeks.     Do not put a pillow under the knee. Place it under the heel.    Complete by:  As directed   Place gray foam under operative heel when in bed or in a chair to work on extension     Increase activity slowly as tolerated    Complete by:  As directed      TED hose    Complete by:  As directed   Use stockings (TED hose) for 2 weeks on both leg(s).  You may remove them at night for sleeping.           Follow-up Information    Follow up with Parkside F, MD. Schedule an appointment as soon as possible for a visit in 2 weeks.   Specialty:  Orthopedic Surgery   Contact information:   [place] [phone]        Signed: Larae Grooms 03/25/2014, 9:53 AM

## 2014-03-24 NOTE — Plan of Care (Signed)
Problem: Consults Goal: Diagnosis- Total Joint Replacement Primary Total Knee Right     

## 2014-03-24 NOTE — Progress Notes (Signed)
Utilization review completed. Koltyn Kelsay, RN, BSN. 

## 2014-03-24 NOTE — Interval H&P Note (Signed)
History and Physical Interval Note:  03/24/2014 8:30 AM  Lauren Montgomery  has presented today for surgery, with the diagnosis of OA RIGHT KNEE  The various methods of treatment have been discussed with the patient and family. After consideration of risks, benefits and other options for treatment, the patient has consented to  Procedure(s): RIGHT TOTAL KNEE ARTHROPLASTY (Right) as a surgical intervention .  The patient's history has been reviewed, patient examined, no change in status, stable for surgery.  I have reviewed the patient's chart and labs.  Questions were answered to the patient's satisfaction.     MURPHY,DANIEL F

## 2014-03-24 NOTE — Transfer of Care (Signed)
Immediate Anesthesia Transfer of Care Note  Patient: Lauren Montgomery  Procedure(s) Performed: Procedure(s): RIGHT TOTAL KNEE ARTHROPLASTY (Right)  Patient Location: PACU  Anesthesia Type:General  Level of Consciousness: awake, alert , oriented and patient cooperative  Airway & Oxygen Therapy: Patient Spontanous Breathing and Patient connected to nasal cannula oxygen  Post-op Assessment: Report given to PACU RN, Post -op Vital signs reviewed and stable and Patient moving all extremities  Post vital signs: Reviewed and stable  Complications: No apparent anesthesia complications

## 2014-03-24 NOTE — Anesthesia Procedure Notes (Signed)
Procedure Name: LMA Insertion Date/Time: 03/24/2014 10:55 AM Performed by: [place] Pink Pre-anesthesia Checklist: Patient identified, Emergency Drugs available, Suction available, Patient being monitored and Timeout performed Patient Re-evaluated:Patient Re-evaluated prior to inductionOxygen Delivery Method: Circle system utilized Preoxygenation: Pre-oxygenation with 100% oxygen Intubation Type: IV induction LMA: LMA inserted LMA Size: 4.0 Number of attempts: 1 Placement Confirmation: positive ETCO2 and breath sounds checked- equal and bilateral Tube secured with: Tape Dental Injury: Teeth and Oropharynx as per pre-operative assessment

## 2014-03-24 NOTE — Anesthesia Postprocedure Evaluation (Signed)
  Anesthesia Post-op Note  Patient: Lauren Montgomery  Procedure(s) Performed: Procedure(s): RIGHT TOTAL KNEE ARTHROPLASTY (Right)  Patient Location: PACU  Anesthesia Type:General  Level of Consciousness: awake, alert , oriented and patient cooperative  Airway and Oxygen Therapy: Patient Spontanous Breathing  Post-op Pain: moderate  Post-op Assessment: Post-op Vital signs reviewed, Patient's Cardiovascular Status Stable, Respiratory Function Stable, Patent Airway, No signs of Nausea or vomiting and Pain level controlled  Post-op Vital Signs: stable  Last Vitals:  Filed Vitals:   03/24/14 1245  BP: 136/80  Pulse: 71  Temp:   Resp: 12    Complications: No apparent anesthesia complications

## 2014-03-25 ENCOUNTER — Encounter (HOSPITAL_COMMUNITY): Payer: Self-pay | Admitting: Orthopedic Surgery

## 2014-03-25 LAB — BASIC METABOLIC PANEL
Anion gap: 6 (ref 5–15)
BUN: 18 mg/dL (ref 6–23)
CO2: 26 mmol/L (ref 19–32)
Calcium: 8.4 mg/dL (ref 8.4–10.5)
Chloride: 109 mmol/L (ref 96–112)
Creatinine, Ser: 0.97 mg/dL (ref 0.50–1.10)
GFR calc non Af Amer: 58 mL/min — ABNORMAL LOW (ref >90)
GFR, EST AFRICAN AMERICAN: 67 mL/min — AB (ref >90)
Glucose, Bld: 114 mg/dL — ABNORMAL HIGH (ref 70–99)
Potassium: 4.1 mmol/L (ref 3.5–5.1)
Sodium: 141 mmol/L (ref 135–145)

## 2014-03-25 LAB — CBC
HEMATOCRIT: 28.8 % — AB (ref 36.0–46.0)
HEMOGLOBIN: 9.6 g/dL — AB (ref 12.0–15.0)
MCH: 30 pg (ref 26.0–34.0)
MCHC: 33.3 g/dL (ref 30.0–36.0)
MCV: 90 fL (ref 78.0–100.0)
PLATELETS: 158 10*3/uL (ref 150–400)
RBC: 3.2 MIL/uL — ABNORMAL LOW (ref 3.87–5.11)
RDW: 14.5 % (ref 11.5–15.5)
WBC: 10.3 10*3/uL (ref 4.0–10.5)

## 2014-03-25 NOTE — Evaluation (Addendum)
Physical Therapy Evaluation Patient Details Name: Lauren Montgomery MRN: [id number] DOB: 1945-03-26 Today's Date: 03/25/2014   History of Present Illness  s/p RIGHT TOTAL KNEE ARTHROPLASTY (  Clinical Impression  Pt is s/p R TKA POD#1  resulting in the deficits listed below (see PT Problem List).  Pt will benefit from skilled PT to increase their independence and safety with mobility to allow discharge to the venue listed below. Pt able to ambulate steps this session, son educated on technique due to decr short term memory of pt. Pt is safe from mobility standpoint to D/C home today if pain controlled. If pain not controlled, will follow pt per POC.     Follow Up Recommendations Home health PT;Supervision/Assistance - 24 hour    Equipment Recommendations  None recommended by PT    Recommendations for Other Services       Precautions / Restrictions Precautions Precautions: Knee Precaution Comments: reviewed no pillow under knee and given HEP handout  Restrictions Weight Bearing Restrictions: Yes RLE Weight Bearing: Weight bearing as tolerated      Mobility  Bed Mobility               General bed mobility comments: not addressed; pt up in chair and returned to chair  Transfers Overall transfer level: Needs assistance Equipment used: Rolling walker (2 wheeled) Transfers: Sit to/from Stand Sit to Stand: Supervision         General transfer comment: min cues for hand placement   Ambulation/Gait Ambulation/Gait assistance: Supervision Ambulation Distance (Feet): 250 Feet Assistive device: Rolling walker (2 wheeled) Gait Pattern/deviations: Step-through pattern;Decreased stride length;Narrow base of support Gait velocity: decr Gait velocity interpretation: Below normal speed for age/gender General Gait Details: pt able to progress to step through gt with multimodal cues; supervision for safety   Stairs Stairs: Yes Stairs assistance: Supervision Stair Management:  One rail Right;Step to pattern;Sideways Number of Stairs: 2 General stair comments: son present for education; educated pt and son on technique    Wheelchair Mobility    Modified Rankin (Stroke Patients Only)       Balance Overall balance assessment: Needs assistance Sitting-balance support: Feet supported;No upper extremity supported Sitting balance-Leahy Scale: Good     Standing balance support: Bilateral upper extremity supported;During functional activity Standing balance-Leahy Scale: Poor Standing balance comment: RW to balance while standing                              Pertinent Vitals/Pain Pain Assessment: 0-10 Pain Score: 4  Pain Descriptors / Indicators: Aching Pain Intervention(s): Monitored during session;Premedicated before session;Repositioned    Home Living Family/patient expects to be discharged to:: Private residence Living Arrangements: Spouse/significant other Available Help at Discharge: Family;Available 24 hours/day Type of Home: House Home Access: Stairs to enter Entrance Stairs-Rails: Right Entrance Stairs-Number of Steps: total of 5 Home Layout: One level Home Equipment: Walker - 2 wheels;Bedside commode;Cane - single point;Adaptive equipment;Tub bench;Grab bars - toilet Additional Comments: L THA in 2013, august 2015 R THA    Prior Function Level of Independence: Independent               Hand Dominance   Dominant Hand: Right    Extremity/Trunk Assessment   Upper Extremity Assessment: Defer to OT evaluation           Lower Extremity Assessment: RLE deficits/detail RLE Deficits / Details: 4 to 90 degrees limited by pain and ACE wrap  Cervical / Trunk Assessment: Normal  Communication   Communication: No difficulties  Cognition Arousal/Alertness: Awake/alert Behavior During Therapy: WFL for tasks assessed/performed Overall Cognitive Status: Impaired/Different from baseline Area of Impairment: Memory      Memory: Decreased short-term memory;Decreased recall of precautions         General Comments: difficulty sequencing tasks; required max cues ; family educated on precautions and stair management technique     General Comments General comments (skin integrity, edema, etc.): reviewed car transfer technique with pt and son; given HEP     Exercises Total Joint Exercises Ankle Circles/Pumps: AROM;10 reps;Both;Seated Quad Sets: AROM;Right;10 reps Hip ABduction/ADduction: AAROM;Right;10 reps Long Arc Quad: AROM;Right;10 reps      Assessment/Plan    PT Assessment Patient needs continued PT services  PT Diagnosis Difficulty walking;Generalized weakness;Acute pain   PT Problem List Decreased strength;Decreased range of motion;Decreased activity tolerance;Decreased balance;Decreased mobility;Decreased knowledge of use of DME;Decreased safety awareness;Pain;Decreased cognition  PT Treatment Interventions DME instruction;Gait training;Stair training;Functional mobility training;Therapeutic activities;Therapeutic exercise;Balance training;Neuromuscular re-education;Patient/family education   PT Goals (Current goals can be found in the Care Plan section) Acute Rehab PT Goals Patient Stated Goal: go home PT Goal Formulation: With patient Time For Goal Achievement: 03/28/14 Potential to Achieve Goals: Good    Frequency 7X/week   Barriers to discharge        Co-evaluation               End of Session Equipment Utilized During Treatment: Gait belt Activity Tolerance: Patient tolerated treatment well Patient left: in chair;with call bell/phone within reach;with family/visitor present Nurse Communication: Mobility status         Time: 1337-1401 PT Time Calculation (min) (ACUTE ONLY): 24 min   Charges:   PT Evaluation $Initial PT Evaluation Tier I: 1 Procedure PT Treatments $Gait Training: 8-22 mins   PT G CodesMelina Modena [place], [place]  [phone] 03/25/2014, 3:24  PM

## 2014-03-25 NOTE — Op Note (Signed)
NAMEBETTYJANE, Montgomery NO.:  [id number]  MEDICAL RECORD NO.:  [id number]  LOCATION:  5N12C                        FACILITY:  [facility]  PHYSICIAN:  [name], M.D. DATE OF BIRTH:  Feb 10, 1946  DATE OF PROCEDURE:  03/24/2014 DATE OF DISCHARGE:                              OPERATIVE REPORT   PREOPERATIVE DIAGNOSES:  Right knee primary generalized end-stage arthritis.  Varus alignment and flexion contracture.  POSTOPERATIVE DIAGNOSES:  Right knee primary generalized end-stage arthritis.  Varus alignment and flexion contracture.  PROCEDURE:  Right knee modified minimally invasive total knee replacement with Stryker triathlon prosthesis.  Soft tissue balancing. Cemented pegged CR #3 femoral component.  Cemented #3 tibial component with a 9 mm CS insert.  Resurfacing cemented 32 mm patellar component.  SURGEON:  [name], M.D.  ASSISTANT:  Eula Listen, PA, present throughout the entire case and necessary for timely completion of procedure.  ANESTHESIA:  General.  BLOOD LOSS:  Minimal.  SPECIMENS:  None.  CULTURES:  None.  COMPLICATIONS:  None.  DRESSINGS:  Soft compressive knee immobilizer.  DRAINS:  Hemovac x1.  TOURNIQUET TIME:  1 hour.  PROCEDURE:  The patient was brought to the operating room, placed on the operating table in a supine position.  After adequate anesthesia had been obtained, tourniquet applied.  Prepped and draped in usual sterile fashion.  Exsanguinated with elevation of Esmarch.  Tourniquet inflated to 350 mmHg.  5 degree flexion contracture, 5 degrees of varus. Anterior approach, vastus splitting preserving quad tendon.  Medial capsule released.  Intramedullary guide, distal femur.  10 mm resection 5 degrees of valgus.  Using epicondylar axis, the femur was sized, cut, and fitted for a pegged #3 CR component.  Proximal tibial resection with extramedullary guide preserving PCL.  Size #3 component.  Debris  cleared throughout the knee.  Patella exposed.  Posterior 10 mm removed. Drilled, sized, and fitted for a 32-mm component.  Trials put in place. With the 9 mm insert, very pleased with motion, stability, tracking. Full extension.  Tibia was marked for rotation and reamed.  All trials were removed.  Copious irrigation with a pulse irrigating device. Cement prepared, placed on all components, firmly seated.  Polyethylene attached to tibia, knee reduced.  Patella held with a clamp.  Once cement hardened, the knee was irrigated again.  Soft tissues were injected with Exparel.  Hemovac placed.  Arthrotomy closed with #1 Vicryl with subcutaneous and subcuticular closure.  Sterile compressive dressing applied.  Tourniquet deflated and removed.  Knee immobilizer applied.  Anesthesia reversed.  Brought to the recovery room.  Tolerated the surgery well.  No complications.     [name], M.D.     DFM/MEDQ  D:  03/24/2014  T:  03/25/2014  Job:  [id number]

## 2014-03-25 NOTE — Progress Notes (Signed)
Dutch Gray discharged home per MD order. Discharge instructions reviewed and discussed with patient. All questions and concerns answered. Copy of instructions and scripts given to patient. IV removed  Patient escorted to car by staff in a wheelchair. No distress noted upon discharge.   Esaw Dace 03/25/2014 4:31 PM

## 2014-03-25 NOTE — Progress Notes (Signed)
03/25/14 Set up with [facility] for HHPT by MD office. Spoke with patient, no change in d/c plan. Spoke with Ruby Cola from [place] [facility], they are providing CPM, rolling walker and 3N1. Patient will have family to assist after d/c.

## 2014-03-25 NOTE — Progress Notes (Signed)
Subjective: 1 Day Post-Op Procedure(s) (LRB): RIGHT TOTAL KNEE ARTHROPLASTY (Right) Patient reports pain as mild.  No nausea/vomiting, lightheadedness/dizziness, chest pain/sob.  Negative flatus/bm.  Tolerating diet.  Objective: Vital signs in last 24 hours: Temp:  [96.3 F (35.7 C)-98.6 F (37 C)] 98.6 F (37 C) (01/28 0534) Pulse Rate:  [56-85] 66 (01/28 0534) Resp:  [6-18] 16 (01/28 0000) BP: (91-147)/(46-115) 98/54 mmHg (01/28 0534) SpO2:  [92 %-100 %] 97 % (01/28 0534) Weight:  [67.5 kg (148 lb 13 oz)] 67.5 kg (148 lb 13 oz) (01/27 1618)  Intake/Output from previous day: 01/27 0701 - 01/28 0700 In: 1840 [P.O.:240; I.V.:1600] Out: 1075 [Urine:375; Drains:650; Blood:50] Intake/Output this shift: Total I/O In: -  Out: 150 [Drains:150]  No results for input(s): HGB in the last 72 hours. No results for input(s): WBC, RBC, HCT, PLT in the last 72 hours. No results for input(s): NA, K, CL, CO2, BUN, CREATININE, GLUCOSE, CALCIUM in the last 72 hours. No results for input(s): LABPT, INR in the last 72 hours.  Neurologically intact Neurovascular intact Sensation intact distally Intact pulses distally Dorsiflexion/Plantar flexion intact Compartment soft  hemovac drain pulled by me today No drainage noted through dressing  Assessment/Plan: 1 Day Post-Op Procedure(s) (LRB): RIGHT TOTAL KNEE ARTHROPLASTY (Right) Advance diet Up with therapy D/C IV fluids Discharge home with home health most likely today pending how well she does with pain control and PT WBAT RLE Dry dressing change prn  ANTON, M. LINDSEY 03/25/2014, 6:37 AM

## 2014-03-25 NOTE — Discharge Instructions (Signed)
Information on my medicine - ELIQUIS (apixaban)  This medication education was reviewed with me or my healthcare representative as part of my discharge preparation.    Why was Eliquis prescribed for you? Eliquis was prescribed for you to reduce the risk of blood clots forming after orthopedic surgery.    What do You need to know about Eliquis? Take your Eliquis 2.5mg  TWICE DAILY - one tablet in the morning and one tablet in the evening with or without food.  It would be best to take the dose about the same time each day.  If you have difficulty swallowing the tablet whole please discuss with your pharmacist how to take the medication safely.  Take Eliquis exactly as prescribed by your doctor and DO NOT stop taking Eliquis without talking to the doctor who prescribed the medication.  Stopping without other medication to take the place of Eliquis may increase your risk of developing a clot.  After discharge, you should have regular check-up appointments with your healthcare provider that is prescribing your Eliquis.  What do you do if you miss a dose? If a dose of ELIQUIS is not taken at the scheduled time, take it as soon as possible on the same day and twice-daily administration should be resumed.  The dose should not be doubled to make up for a missed dose.  Do not take more than one tablet of ELIQUIS at the same time.  Important Safety Information A possible side effect of Eliquis is bleeding. You should call your healthcare provider right away if you experience any of the following: ? Bleeding from an injury or your nose that does not stop. ? Unusual colored urine (red or dark brown) or unusual colored stools (red or black). ? Unusual bruising for unknown reasons. ? A serious fall or if you hit your head (even if there is no bleeding).  Some medicines may interact with Eliquis and might increase your risk of bleeding or clotting while on Eliquis. To help avoid this, consult  your healthcare provider or pharmacist prior to using any new prescription or non-prescription medications, including herbals, vitamins, non-steroidal anti-inflammatory drugs (NSAIDs) and supplements.  This website has more information on Eliquis (apixaban): http://www.eliquis.com/eliquis/home     Total Knee Replacement, Care After Refer to this sheet in the next few weeks. These instructions provide you with information on caring for yourself after your procedure. Your health care provider also may give you specific instructions. Your treatment has been planned according to the most current medical practices, but problems sometimes occur. Call your health care provider if you have any problems or questions after your procedure. HOME CARE INSTRUCTIONS   Weight bearing as tolerated.  Take Eliquis as directed for a total of 12 days following surgery to prevent blood clots.  Change bandage daily starting on Saturday.  May shower on Monday, but do not soak incision.  May apply ice for up to 20 minutes at a time for pain and swelling.  Follow up appointment in two weeks.   See a physical therapist as directed by your health care provider.  Take medicines only as directed by your health care provider.  Avoid lifting or driving until you are instructed otherwise.  If you have been sent home with a continuous passive motion machine, use it as directed by your health care provider. SEEK MEDICAL CARE IF:  You have difficulty breathing.  You have drainage, redness, swelling, or pain at your incision site.  You have a bad smell  coming from your incision site.  You have persistent bleeding from your incision site.  Your incision breaks open after sutures (stitches) or staples have been removed.  You have a fever. SEEK IMMEDIATE MEDICAL CARE IF:   You have a rash.  You have pain or swelling in your calf or thigh.  You have shortness of breath or chest pain.  Your range of motion in your  knee is decreasing rather than increasing. MAKE SURE YOU:   Understand these instructions.  Will watch your condition.  Will get help right away if you are not doing well or get worse. Document Released: 09/01/2004 Document Revised: 06/29/2013 Document Reviewed: 04/03/2011 [facility] Patient Information 2015 [place], [place]. This information is not intended to replace advice given to you by your health care provider. Make sure you discuss any questions you have with your health care provider.

## 2014-03-25 NOTE — Evaluation (Signed)
Occupational Therapy Evaluation Patient Details Name: Lauren Montgomery MRN: [id number] DOB: 06-04-1945 Today's Date: 03/25/2014    History of Present Illness s/p RIGHT TOTAL KNEE ARTHROPLASTY (   Clinical Impression   Patient independent>min assist for LB ADLs PTA. Patient currently requires up to total assist for LB ADLs and min guard for functional mobility/functional transfers. Patient will benefit from acute OT to increase overall independence in the areas of ADLs, functional mobility, and safey in order to safely discharge home with supervision/assistance from husband.     Follow Up Recommendations  No OT follow up;Supervision/Assistance - 24 hour    Equipment Recommendations  None recommended by OT    Recommendations for Other Services  None at this time.      Precautions / Restrictions Precautions Precautions: Fall Restrictions Weight Bearing Restrictions: Yes RLE Weight Bearing: Weight bearing as tolerated      Mobility Bed Mobility Overal bed mobility: Needs Assistance Bed Mobility: Rolling;Supine to Sit Rolling: Min assist   Supine to sit: Min assist;HOB elevated     General bed mobility comments: required assistance for management of RLE, using bed rails, and with HOB elevated  Transfers Overall transfer level: Needs assistance Equipment used: Rolling walker (2 wheeled) Transfers: Sit to/from Stand Sit to Stand: Min guard   Balance  Defer to PT evaluation      ADL Overall ADL's : Needs assistance/impaired Eating/Feeding: Independent   Grooming: Supervision/safety;Standing   Upper Body Bathing: Supervision/ safety;Sitting   Lower Body Bathing: Moderate assistance;Sit to/from stand   Upper Body Dressing : Supervision/safety;Sitting   Lower Body Dressing: Total assistance;Sit to/from stand;Cueing for safety   Toilet Transfer: Min guard;RW;Comfort height toilet   Toileting- Water quality scientist and Hygiene: Min guard;Sit to/from stand        Functional mobility during ADLs: Min guard;Rolling walker General ADL Comments: Informed/educated patient on use of AE to help increase independence with LB ADLs, patient will need additional education (demonstrational and teach back). Patient with poor memory during OT eval/treat, suspect due to medications?                Pertinent Vitals/Pain Pain Assessment: 0-10 Pain Score: 6  Pain Location: knee Pain Descriptors / Indicators: Aching Pain Intervention(s): Monitored during session;Premedicated before session     Hand Dominance Right   Extremity/Trunk Assessment Upper Extremity Assessment Upper Extremity Assessment: Overall WFL for tasks assessed   Lower Extremity Assessment Lower Extremity Assessment: Defer to PT evaluation   Cervical / Trunk Assessment Cervical / Trunk Assessment: Normal   Communication Communication Communication: No difficulties   Cognition Arousal/Alertness: Awake/alert Behavior During Therapy: WFL for tasks assessed/performed Overall Cognitive Status: Impaired/Different from baseline Area of Impairment: Memory    General Comments: secondary to medications?              Home Living Family/patient expects to be discharged to:: Private residence Living Arrangements: Spouse/significant other Available Help at Discharge: Family;Available 24 hours/day Type of Home: House Home Access: Stairs to enter [employer] of Steps: total of 5 Entrance Stairs-Rails: Right Home Layout: One level     Bathroom Shower/Tub: Walk-in shower;Door   [employer] Toilet: Standard     Home Equipment: Environmental consultant - 2 wheels;Bedside commode;Cane - single point;Adaptive equipment;Tub bench;Grab bars - toilet   Additional Comments: L THA in 2013, august 2015 R THA      Prior Functioning/Environment Level of Independence: Independent             OT Diagnosis: Generalized weakness;Acute pain  OT Problem List: Decreased range of motion;Decreased  activity tolerance;Decreased strength;Decreased safety awareness;Decreased knowledge of use of DME or AE;Decreased knowledge of precautions;Pain   OT Treatment/Interventions: Self-care/ADL training;Energy conservation;DME and/or AE instruction;Therapeutic activities;Patient/family education    OT Goals(Current goals can be found in the care plan section) Acute Rehab OT Goals Patient Stated Goal: go home OT Goal Formulation: With patient/family Time For Goal Achievement: 04/01/14 Potential to Achieve Goals: Good ADL Goals Pt Will Perform Lower Body Bathing: with modified independence;sit to/from stand;with adaptive equipment Pt Will Perform Lower Body Dressing: with modified independence;with adaptive equipment;sit to/from stand Pt Will Transfer to Toilet: with modified independence;ambulating;bedside commode Additional ADL Goal #1: Patient will engage in bed mobility with mod I in order to prepare for functional transfers/mobility and ADL  OT Frequency: Min 2X/week   Barriers to D/C:  None known at this time    End of Session Equipment Utilized During Treatment: Rolling walker CPM Right Knee CPM Right Knee: Off  Activity Tolerance:   Patient left: in chair;with call bell/phone within reach   Time: 0910-0937 OT Time Calculation (min): 27 min  Charges:  OT General Charges $OT Visit: 1 Procedure OT Evaluation $Initial OT Evaluation Tier I: 1 Procedure OT Treatments $Self Care/Home Management : 8-22 mins  Johnnisha Forton , MS, OTR/L, CLT Pager: [phone]  03/25/2014, 9:50 AM

## 2014-10-23 ENCOUNTER — Emergency Department (HOSPITAL_COMMUNITY)
Admission: EM | Admit: 2014-10-23 | Discharge: 2014-10-23 | Disposition: A | Discharge status: Home / Self Care | Payer: Medicare Other | Attending: Emergency Medicine | Admitting: Emergency Medicine

## 2014-10-23 ENCOUNTER — Encounter (HOSPITAL_COMMUNITY): Payer: Self-pay | Admitting: Emergency Medicine

## 2014-10-23 DIAGNOSIS — B029 Zoster without complications: Secondary | ICD-10-CM | POA: Diagnosis not present

## 2014-10-23 DIAGNOSIS — Z79899 Other long term (current) drug therapy: Secondary | ICD-10-CM | POA: Diagnosis not present

## 2014-10-23 DIAGNOSIS — Z8739 Personal history of other diseases of the musculoskeletal system and connective tissue: Secondary | ICD-10-CM | POA: Insufficient documentation

## 2014-10-23 DIAGNOSIS — Z85828 Personal history of other malignant neoplasm of skin: Secondary | ICD-10-CM | POA: Insufficient documentation

## 2014-10-23 DIAGNOSIS — H269 Unspecified cataract: Secondary | ICD-10-CM | POA: Insufficient documentation

## 2014-10-23 DIAGNOSIS — Z7902 Long term (current) use of antithrombotics/antiplatelets: Secondary | ICD-10-CM | POA: Insufficient documentation

## 2014-10-23 DIAGNOSIS — R220 Localized swelling, mass and lump, head: Secondary | ICD-10-CM | POA: Diagnosis present

## 2014-10-23 MED ORDER — TETRACAINE HCL 0.5 % OP SOLN
1.0000 [drp] | Freq: Once | OPHTHALMIC | Status: DC
  Filled 2014-10-23: qty 2

## 2014-10-23 MED ORDER — FLUORESCEIN SODIUM 1 MG OP STRP
1.0000 | ORAL_STRIP | Freq: Once | OPHTHALMIC | Status: DC
  Filled 2014-10-23: qty 1

## 2014-10-23 NOTE — ED Provider Notes (Signed)
CSN: [id number]     Arrival date & time 10/23/14  1205 History   First MD Initiated Contact with Patient 10/23/14 1229     Chief Complaint  Patient presents with  . Herpes Zoster  . Facial Swelling     (Consider location/radiation/quality/duration/timing/severity/associated sxs/prior Treatment) HPI Lauren Montgomery is a 69 y.o. female who comes in for evaluation of facial swelling and herpes zoster. Patient states on Wednesday she was diagnosed with shingles by her PCP on her right forehead. She reports yesterday she began to notice her right eyelid was turning red and starting to swell. She called her PCP and was told to go to the ED for ophthalmic exam. Denies any vision changes. No eye pain, eye discharge, no lesions on nose. Patient reports that she is currently on medication to treat her shingles. No other aggravating or modifying factors.  Past Medical History  Diagnosis Date  . Skin cancer     nose  . Blood transfusion     with childbirth  . Osteoarthritis   . Cataract     left eye  . Degenerative joint disease   . PONV (postoperative nausea and vomiting)     denies,    Past Surgical History  Procedure Laterality Date  . Bone anchored hearing aid implant Left   . Carpal tunnel release      right hand  . Tubal ligation    . Bunionectomy Bilateral     feet  . Total hip arthroplasty  03/14/2011    Procedure: TOTAL HIP ARTHROPLASTY;  Surgeon: [name], MD;  Location: [facility];  Service: Orthopedics;  Laterality: Left;  120 MINUTES FOR THIS SURGERY  . Total hip arthroplasty Right 09/02/2013    Procedure: TOTAL HIP ARTHROPLASTY ANTERIOR APPROACH: Disolacted trial head resection muscular abdominal inguinal ligament.;  Surgeon: [name], MD;  Location: [facility];  Service: Orthopedics;  Laterality: Right;  . Joint replacement    . Total knee arthroplasty Right 03/24/2014    Procedure: RIGHT TOTAL KNEE ARTHROPLASTY;  Surgeon: [name], MD;  Location: [facility];  Service:  Orthopedics;  Laterality: Right;   Family History  Problem Relation Age of Onset  . Adopted: Yes   Social History  Substance Use Topics  . Smoking status: Never Smoker   . Smokeless tobacco: Never Used  . Alcohol Use: No   OB History    No data available     Review of Systems A 10 point review of systems was completed and was negative except for pertinent positives and negatives as mentioned in the history of present illness     Allergies  Codeine  Home Medications   Prior to Admission medications   Medication Sig Start Date End Date Taking? Authorizing Provider  acetaminophen (TYLENOL) 500 MG tablet Take 1,000 mg by mouth every 6 (six) hours as needed for mild pain or headache.   Yes Historical Provider, MD  atorvastatin (LIPITOR) 10 MG tablet Take 10 mg by mouth daily.   Yes Historical Provider, MD  valACYclovir (VALTREX) 1000 MG tablet Take 1,000 mg by mouth 3 (three) times daily.   Yes Historical Provider, MD  apixaban (ELIQUIS) 2.5 MG TABS tablet Take 1 tab PO BID x 12 days following surgery to prevent blood clots Patient not taking: Reported on 10/23/2014 03/24/14   Aundra Dubin, PA-C  bisacodyl (DULCOLAX) 5 MG EC tablet Take 1 tablet (5 mg total) by mouth daily as needed for moderate constipation. Patient not taking:  Reported on 10/23/2014 03/24/14   Aundra Dubin, PA-C  HYDROmorphone (DILAUDID) 2 MG tablet Take 1 tablet (2 mg total) by mouth every 4 (four) hours as needed for severe pain. Patient not taking: Reported on 10/23/2014 03/24/14   Aundra Dubin, PA-C  methocarbamol (ROBAXIN) 500 MG tablet Take 1 tablet (500 mg total) by mouth 4 (four) times daily. Patient not taking: Reported on 10/23/2014 03/24/14   Aundra Dubin, PA-C  ondansetron (ZOFRAN) 4 MG tablet Take 1 tablet (4 mg total) by mouth every 8 (eight) hours as needed for nausea or vomiting. Patient not taking: Reported on 10/23/2014 03/24/14   Aundra Dubin, PA-C   BP 164/79 mmHg  Pulse 74   Temp(Src) 98.2 F (36.8 C) (Oral)  Resp 21  SpO2 95% Physical Exam  Constitutional: She is oriented to person, place, and time. She appears well-developed and well-nourished.  HENT:  Head: Normocephalic and atraumatic.  Mouth/Throat: Oropharynx is clear and moist.  Eyes: Conjunctivae are normal. Pupils are equal, round, and reactive to light. Right eye exhibits no discharge. Left eye exhibits no discharge. No scleral icterus.  Diffuse rash consistent with zoster noted to right forehead, eyelid. No lesions noted to nose. Rash does not cross midline. Eyes appear normal without injection, drainage. Upon Woods lamp evaluation, no dendritic lesions or corneal abrasions noted.  Neck: Neck supple.  Cardiovascular: Normal rate, regular rhythm and normal heart sounds.   Pulmonary/Chest: Effort normal and breath sounds normal. No respiratory distress. She has no wheezes. She has no rales.  Abdominal: Soft. There is no tenderness.  Musculoskeletal: She exhibits no tenderness.  Neurological: She is alert and oriented to person, place, and time.  Cranial Nerves II-XII grossly intact  Skin: Skin is warm and dry. No rash noted.  Psychiatric: She has a normal mood and affect.  Nursing note and vitals reviewed.   ED Course  Procedures (including critical care time) Labs Review Labs Reviewed - No data to display  Imaging Review No results found. I have personally reviewed and evaluated these images and lab results as part of my medical decision-making.   EKG Interpretation None     Meds given in ED:  Medications  fluorescein ophthalmic strip 1 strip (not administered)  tetracaine (PONTOCAINE) 0.5 % ophthalmic solution 1 drop (not administered)    New Prescriptions   No medications on file   Filed Vitals:   10/23/14 1216  BP: 164/79  Pulse: 74  Temp: 98.2 F (36.8 C)  TempSrc: Oral  Resp: 21  SpO2: 95%    MDM  Vitals stable - WNL -afebrile Pt resting comfortably in  ED. PE--zoster located to right forehead and scalp. Eyes and conjunctiva appear normal. Woods lamp shows no dendritic lesions or corneal abrasions.  DDX--no evidence of zoster ophthalmicus. Continue prescription for valacyclovir. Discussed follow-up with primary care for reevaluation in 3 days or sooner if symptoms of eye pain, discharge, eye redness or lesions on nose present.  I discussed all relevant lab findings and imaging results with pt and they verbalized understanding. Discussed f/u with PCP within 48 hrs and return precautions, pt very amenable to plan. Prior to patient discharge, I discussed and reviewed this case with Dr.Steinl    Final diagnoses:  Herpes zoster       Comer Locket, PA-C 10/23/14 [place], MD 10/24/14 [phone]

## 2014-10-23 NOTE — ED Notes (Signed)
Pt states that she was diagnosed with shingles on her head couple days ago.  Pt started having swelling to right eye and called her PCP today and was told to go to ED for opthalmic exam.  Pt denies any visual problems with right eye, just feels weird due to the swelling. Pt is taking pill for the shingles.

## 2014-10-23 NOTE — Discharge Instructions (Signed)
It is important to follow up with your primary care referral for evaluation and management of your symptoms. If you begin to experience eye pain, eye redness, discharge or spots on your nose, it is important for you to seek immediate emergent or ophthalmic evaluation  Shingles Shingles (herpes zoster) is an infection that is caused by the same virus that causes chickenpox (varicella). The infection causes a painful skin rash and fluid-filled blisters, which eventually break open, crust over, and heal. It may occur in any area of the body, but it usually affects only one side of the body or face. The pain of shingles usually lasts about 1 month. However, some people with shingles may develop long-term (chronic) pain in the affected area of the body. Shingles often occurs many years after the person had chickenpox. It is more common:  In people older than 50 years.  In people with weakened immune systems, such as those with HIV, AIDS, or cancer.  In people taking medicines that weaken the immune system, such as transplant medicines.  In people under great stress. CAUSES  Shingles is caused by the varicella zoster virus (VZV), which also causes chickenpox. After a person is infected with the virus, it can remain in the person's body for years in an inactive state (dormant). To cause shingles, the virus reactivates and breaks out as an infection in a nerve root. The virus can be spread from person to person (contagious) through contact with open blisters of the shingles rash. It will only spread to people who have not had chickenpox. When these people are exposed to the virus, they may develop chickenpox. They will not develop shingles. Once the blisters scab over, the person is no longer contagious and cannot spread the virus to others. SIGNS AND SYMPTOMS  Shingles shows up in stages. The initial symptoms may be pain, itching, and tingling in an area of the skin. This pain is usually described as  burning, stabbing, or throbbing.In a few days or weeks, a painful red rash will appear in the area where the pain, itching, and tingling were felt. The rash is usually on one side of the body in a band or belt-like pattern. Then, the rash usually turns into fluid-filled blisters. They will scab over and dry up in approximately 2-3 weeks. Flu-like symptoms may also occur with the initial symptoms, the rash, or the blisters. These may include:  Fever.  Chills.  Headache.  Upset stomach. DIAGNOSIS  Your health care provider will perform a skin exam to diagnose shingles. Skin scrapings or fluid samples may also be taken from the blisters. This sample will be examined under a microscope or sent to a lab for further testing. TREATMENT  There is no specific cure for shingles. Your health care provider will likely prescribe medicines to help you manage the pain, recover faster, and avoid long-term problems. This may include antiviral drugs, anti-inflammatory drugs, and pain medicines. HOME CARE INSTRUCTIONS   Take a cool bath or apply cool compresses to the area of the rash or blisters as directed. This may help with the pain and itching.   Take medicines only as directed by your health care provider.   Rest as directed by your health care provider.  Keep your rash and blisters clean with mild soap and cool water or as directed by your health care provider.  Do not pick your blisters or scratch your rash. Apply an anti-itch cream or numbing creams to the affected area as directed by  your health care provider.  Keep your shingles rash covered with a loose bandage (dressing).  Avoid skin contact with:  Babies.   Pregnant women.   Children with eczema.   Elderly people with transplants.   People with chronic illnesses, such as leukemia or AIDS.   Wear loose-fitting clothing to help ease the pain of material rubbing against the rash.  Keep all follow-up visits as directed by  your health care provider.If the area involved is on your face, you may receive a referral for a specialist, such as an eye doctor (ophthalmologist) or an ear, nose, and throat (ENT) doctor. Keeping all follow-up visits will help you avoid eye problems, chronic pain, or disability.  SEEK IMMEDIATE MEDICAL CARE IF:   You have facial pain, pain around the eye area, or loss of feeling on one side of your face.  You have ear pain or ringing in your ear.  You have loss of taste.  Your pain is not relieved with prescribed medicines.   Your redness or swelling spreads.   You have more pain and swelling.  Your condition is worsening or has changed.   You have a fever. MAKE SURE YOU:  Understand these instructions.  Will watch your condition.  Will get help right away if you are not doing well or get worse. Document Released: 02/12/2005 Document Revised: 06/29/2013 Document Reviewed: 09/27/2011 [facility] Patient Information 2015 [place], [place]. This information is not intended to replace advice given to you by your health care provider. Make sure you discuss any questions you have with your health care provider.

## 2015-09-27 ENCOUNTER — Other Ambulatory Visit: Payer: Self-pay | Admitting: Physician Assistant

## 2015-09-27 NOTE — H&P (Signed)
TOTAL KNEE ADMISSION H&P  Patient is being admitted for left total knee arthroplasty.  Subjective:  Chief Complaint:left knee pain.  HPI: Lauren Montgomery, 70 y.o. female, has a history of pain and functional disability in the left knee due to arthritis and has failed non-surgical conservative treatments for greater than 12 weeks to includeNSAID's and/or analgesics, corticosteriod injections and activity modification.  Onset of symptoms was abrupt, starting 1 years ago with rapidlly worsening course since that time. The patient noted no past surgery on the left knee(s).  Patient currently rates pain in the left knee(s) at 7 out of 10 with activity. Patient has night pain, worsening of pain with activity and weight bearing and pain that interferes with activities of daily living.  Patient has evidence of subchondral cysts by imaging study. There is no active infection.  Patient Active Problem List   Diagnosis Date Noted  . DJD (degenerative joint disease) of knee 03/24/2014  . Primary localized osteoarthrosis, pelvic region and thigh 09/02/2013   Past Medical History:  Diagnosis Date  . Blood transfusion    with childbirth  . Cataract    left eye  . Degenerative joint disease   . Osteoarthritis   . PONV (postoperative nausea and vomiting)    denies,   . Skin cancer    nose    Past Surgical History:  Procedure Laterality Date  . BONE ANCHORED HEARING AID IMPLANT Left   . BUNIONECTOMY Bilateral    feet  . CARPAL TUNNEL RELEASE     right hand  . JOINT REPLACEMENT    . TOTAL HIP ARTHROPLASTY  03/14/2011   Procedure: TOTAL HIP ARTHROPLASTY;  Surgeon: [name], MD;  Location: [facility];  Service: Orthopedics;  Laterality: Left;  120 MINUTES FOR THIS SURGERY  . TOTAL HIP ARTHROPLASTY Right 09/02/2013   Procedure: TOTAL HIP ARTHROPLASTY ANTERIOR APPROACH: Disolacted trial head resection muscular abdominal inguinal ligament.;  Surgeon: [name], MD;  Location: [facility];  Service:  Orthopedics;  Laterality: Right;  . TOTAL KNEE ARTHROPLASTY Right 03/24/2014   Procedure: RIGHT TOTAL KNEE ARTHROPLASTY;  Surgeon: [name], MD;  Location: [facility];  Service: Orthopedics;  Laterality: Right;  . TUBAL LIGATION       (Not in a hospital admission) Allergies  Allergen Reactions  . Codeine Nausea Only    Social History  Substance Use Topics  . Smoking status: Never Smoker  . Smokeless tobacco: Never Used  . Alcohol use No    Family History  Problem Relation Age of Onset  . Adopted: Yes     Review of Systems  Constitutional: Negative.   HENT: Positive for hearing loss.   Eyes: Negative.   Respiratory: Negative.   Cardiovascular: Negative.   Gastrointestinal: Negative.   Genitourinary: Negative.   Musculoskeletal: Positive for joint pain.  Skin: Negative.   Neurological: Positive for headaches. Negative for dizziness, tingling, tremors, sensory change, speech change, focal weakness, seizures and loss of consciousness.  Endo/Heme/Allergies: Negative.   Psychiatric/Behavioral: Negative.     Objective:  Physical Exam  Constitutional: She is oriented to person, place, and time. She appears well-developed and well-nourished.  HENT:  Head: Normocephalic and atraumatic.  Eyes: EOM are normal. Pupils are equal, round, and reactive to light.  Neck: Normal range of motion. Neck supple.  Cardiovascular: Normal rate and regular rhythm.   Respiratory: Effort normal and breath sounds normal.  GI: Soft. Bowel sounds are normal.  Musculoskeletal:  Motion 0-120.  On the left  she has varus alignment.  Partially correctable.  Motion 0-100 with tibiofemoral and patellofemoral crepitus.  Neurovascularly intact distally.    Neurological: She is alert and oriented to person, place, and time.  Skin: Skin is warm and dry.  Psychiatric: She has a normal mood and affect. Her behavior is normal. Judgment and thought content normal.    Vital signs in last 24  hours: @VSRANGES @  Labs:   Estimated body mass index is 27.22 kg/m as calculated from the following:   Height as of 03/24/14: 5\' 2"  (1.575 m).   Weight as of 03/24/14: 67.5 kg (148 lb 13 oz).   Imaging Review Plain radiographs demonstrate severe degenerative joint disease of the left knee(s). The overall alignment ismild varus. The bone quality appears to be fair for age and reported activity level.  Assessment/Plan:  End stage arthritis, left knee   The patient history, physical examination, clinical judgment of the provider and imaging studies are consistent with end stage degenerative joint disease of the left knee(s) and total knee arthroplasty is deemed medically necessary. The treatment options including medical management, injection therapy arthroscopy and arthroplasty were discussed at length. The risks and benefits of total knee arthroplasty were presented and reviewed. The risks due to aseptic loosening, infection, stiffness, patella tracking problems, thromboembolic complications and other imponderables were discussed. The patient acknowledged the explanation, agreed to proceed with the plan and consent was signed. Patient is being admitted for inpatient treatment for surgery, pain control, PT, OT, prophylactic antibiotics, VTE prophylaxis, progressive ambulation and ADL's and discharge planning. The patient is planning to be discharged home with home health services

## 2015-09-28 NOTE — Pre-Procedure Instructions (Signed)
Lauren Montgomery  09/28/2015      [id number] - [place], [place] - [address] [place] [address] Phone: [phone] Fax: [phone]    Your procedure is scheduled on   Wednesday  10/12/15  Report to [facility] Admitting at 815 A.M.  Call this number if you have problems the morning of surgery:  [phone]   Remember:  Do not eat food or drink liquids after midnight.  Take these medicines the morning of surgery with A SIP OF WATER  TYLENOL IF NEEDED (STOP ASPIRIN OR ASPIRIN PRODUCTS, IBUPROFEN, MOTRIN, ADVIL, GOODY POWDERS/ BC'S ,HERBAL MEDICINES)   Do not wear jewelry, make-up or nail polish.  Do not wear lotions, powders, or perfumes.  You may wear deoderant.  Do not shave 48 hours prior to surgery.  Men may shave face and neck.  Do not bring valuables to the hospital.  [facility] is not responsible for any belongings or valuables.  Contacts, dentures or bridgework may not be worn into surgery.  Leave your suitcase in the car.  After surgery it may be brought to your room.  For patients admitted to the hospital, discharge time will be determined by your treatment team.  Patients discharged the day of surgery will not be allowed to drive home.   Name and phone number of your driver:    Special instructions:  [facility] - Preparing for Surgery  Before surgery, you can play an important role.  Because skin is not sterile, your skin needs to be as free of germs as possible.  You can reduce the number of germs on you skin by washing with CHG (chlorahexidine gluconate) soap before surgery.  CHG is an antiseptic cleaner which kills germs and bonds with the skin to continue killing germs even after washing.  Please DO NOT use if you have an allergy to CHG or antibacterial soaps.  If your skin becomes reddened/irritated stop using the CHG and inform your nurse when you arrive at Short Stay.  Do  not shave (including legs and underarms) for at least 48 hours prior to the first CHG shower.  You may shave your face.  Please follow these instructions carefully:   1.  Shower with CHG Soap the night before surgery and the                                morning of Surgery.  2.  If you choose to wash your hair, wash your hair first as usual with your       normal shampoo.  3.  After you shampoo, rinse your hair and body thoroughly to remove the                      Shampoo.  4.  Use CHG as you would any other liquid soap.  You can apply chg directly       to the skin and wash gently with scrungie or a clean washcloth.  5.  Apply the CHG Soap to your body ONLY FROM THE NECK DOWN.        Do not use on open wounds or open sores.  Avoid contact with your eyes,       ears, mouth and genitals (private parts).  Wash genitals (private parts)  with your normal soap.  6.  Wash thoroughly, paying special attention to the area where your surgery        will be performed.  7.  Thoroughly rinse your body with warm water from the neck down.  8.  DO NOT shower/wash with your normal soap after using and rinsing off       the CHG Soap.  9.  Pat yourself dry with a clean towel.            10.  Wear clean pajamas.            11.  Place clean sheets on your bed the night of your first shower and do not        sleep with pets.  Day of Surgery  Do not apply any lotions/deoderants the morning of surgery.  Please wear clean clothes to the hospital/surgery center.    Please read over the following fact sheets that you were given.  MSRA information

## 2015-09-29 ENCOUNTER — Encounter (HOSPITAL_COMMUNITY)
Admission: RE | Admit: 2015-09-29 | Discharge: 2015-09-29 | Disposition: A | Discharge status: Home / Self Care | Payer: Medicare Other | Source: Ambulatory Visit | Attending: Orthopedic Surgery | Admitting: Orthopedic Surgery

## 2015-09-29 ENCOUNTER — Other Ambulatory Visit (HOSPITAL_COMMUNITY): Payer: Self-pay | Admitting: *Deleted

## 2015-09-29 DIAGNOSIS — Z0183 Encounter for blood typing: Secondary | ICD-10-CM | POA: Diagnosis not present

## 2015-09-29 DIAGNOSIS — Z01818 Encounter for other preprocedural examination: Secondary | ICD-10-CM | POA: Insufficient documentation

## 2015-09-29 DIAGNOSIS — M1712 Unilateral primary osteoarthritis, left knee: Secondary | ICD-10-CM | POA: Insufficient documentation

## 2015-09-29 DIAGNOSIS — Z01812 Encounter for preprocedural laboratory examination: Secondary | ICD-10-CM | POA: Diagnosis not present

## 2015-09-29 LAB — SURGICAL PCR SCREEN
MRSA, PCR: NEGATIVE
STAPHYLOCOCCUS AUREUS: NEGATIVE

## 2015-09-29 LAB — BASIC METABOLIC PANEL
ANION GAP: 7 (ref 5–15)
BUN: 16 mg/dL (ref 6–20)
CALCIUM: 9.6 mg/dL (ref 8.9–10.3)
CO2: 29 mmol/L (ref 22–32)
Chloride: 105 mmol/L (ref 101–111)
Creatinine, Ser: 0.87 mg/dL (ref 0.44–1.00)
GFR calc Af Amer: >60 mL/min (ref >60)
GLUCOSE: 111 mg/dL — AB (ref 65–99)
POTASSIUM: 3.7 mmol/L (ref 3.5–5.1)
SODIUM: 141 mmol/L (ref 135–145)

## 2015-09-29 LAB — CBC
HCT: 41.4 % (ref 36.0–46.0)
Hemoglobin: 13.3 g/dL (ref 12.0–15.0)
MCH: 29.6 pg (ref 26.0–34.0)
MCHC: 32.1 g/dL (ref 30.0–36.0)
MCV: 92.2 fL (ref 78.0–100.0)
PLATELETS: 216 10*3/uL (ref 150–400)
RBC: 4.49 MIL/uL (ref 3.87–5.11)
RDW: 13.5 % (ref 11.5–15.5)
WBC: 6 10*3/uL (ref 4.0–10.5)

## 2015-09-29 LAB — TYPE AND SCREEN
ABO/RH(D): B NEG
Antibody Screen: NEGATIVE

## 2015-09-29 NOTE — Progress Notes (Signed)
Pt denies cardiac history, cardiologist, or cardiac studies.  PCP Dr Lucie Leather with [facility] in [place]. Last seen 09/27/15 for pre op evaluation. Note in Care Everywhere.  EKG today.

## 2015-10-11 ENCOUNTER — Encounter (HOSPITAL_COMMUNITY): Payer: Self-pay | Admitting: Anesthesiology

## 2015-10-11 MED ORDER — CEFAZOLIN SODIUM-DEXTROSE 2-4 GM/100ML-% IV SOLN
2.0000 g | INTRAVENOUS | Status: AC
  Administered 2015-10-12 (×2): 2 g via INTRAVENOUS
  Filled 2015-10-11: qty 100

## 2015-10-11 MED ORDER — TRANEXAMIC ACID 1000 MG/10ML IV SOLN
1000.0000 mg | INTRAVENOUS | Status: AC
  Administered 2015-10-12: 1000 mg via INTRAVENOUS
  Filled 2015-10-11: qty 10

## 2015-10-11 MED ORDER — LACTATED RINGERS IV SOLN: INTRAVENOUS | Status: DC

## 2015-10-12 ENCOUNTER — Inpatient Hospital Stay (HOSPITAL_COMMUNITY)
Admission: RE | Admit: 2015-10-12 | Discharge: 2015-10-13 | DRG: 470 | Disposition: A | Discharge status: Home Health | Payer: Medicare Other | Source: Ambulatory Visit | Attending: Orthopedic Surgery | Admitting: Orthopedic Surgery

## 2015-10-12 ENCOUNTER — Inpatient Hospital Stay (HOSPITAL_COMMUNITY): Payer: Medicare Other

## 2015-10-12 ENCOUNTER — Encounter (HOSPITAL_COMMUNITY): Payer: Self-pay | Admitting: Surgery

## 2015-10-12 ENCOUNTER — Inpatient Hospital Stay (HOSPITAL_COMMUNITY): Payer: Medicare Other | Admitting: Anesthesiology

## 2015-10-12 ENCOUNTER — Encounter (HOSPITAL_COMMUNITY)
Admission: RE | Disposition: A | Discharge status: Home Health | Payer: Self-pay | Source: Ambulatory Visit | Attending: Orthopedic Surgery

## 2015-10-12 DIAGNOSIS — Z885 Allergy status to narcotic agent status: Secondary | ICD-10-CM

## 2015-10-12 DIAGNOSIS — M1712 Unilateral primary osteoarthritis, left knee: Principal | ICD-10-CM | POA: Diagnosis present

## 2015-10-12 DIAGNOSIS — Z85828 Personal history of other malignant neoplasm of skin: Secondary | ICD-10-CM

## 2015-10-12 DIAGNOSIS — Z96643 Presence of artificial hip joint, bilateral: Secondary | ICD-10-CM | POA: Diagnosis present

## 2015-10-12 DIAGNOSIS — Z9629 Presence of other otological and audiological implants: Secondary | ICD-10-CM | POA: Diagnosis present

## 2015-10-12 DIAGNOSIS — Z96659 Presence of unspecified artificial knee joint: Secondary | ICD-10-CM

## 2015-10-12 DIAGNOSIS — D62 Acute posthemorrhagic anemia: Secondary | ICD-10-CM | POA: Diagnosis not present

## 2015-10-12 DIAGNOSIS — Z23 Encounter for immunization: Secondary | ICD-10-CM | POA: Diagnosis not present

## 2015-10-12 HISTORY — DX: Pure hypercholesterolemia, unspecified: E78.00

## 2015-10-12 HISTORY — DX: Personal history of other medical treatment: Z92.89

## 2015-10-12 HISTORY — DX: Contact with and (suspected) exposure to tuberculosis: Z20.1

## 2015-10-12 HISTORY — PX: TOTAL KNEE ARTHROPLASTY: SHX125

## 2015-10-12 HISTORY — PX: KNEE ARTHROPLASTY: SHX992

## 2015-10-12 SURGERY — ARTHROPLASTY, KNEE, TOTAL
Anesthesia: General | Site: Knee | Laterality: Left

## 2015-10-12 MED ORDER — PROPOFOL 10 MG/ML IV BOLUS
INTRAVENOUS | Status: DC | PRN
  Administered 2015-10-12: 150 mg via INTRAVENOUS

## 2015-10-12 MED ORDER — MIDAZOLAM HCL 5 MG/5ML IJ SOLN
INTRAMUSCULAR | Status: DC | PRN
  Administered 2015-10-12: 2 mg via INTRAVENOUS

## 2015-10-12 MED ORDER — BUPIVACAINE HCL 0.5 % IJ SOLN
INTRAMUSCULAR | Status: DC | PRN
  Administered 2015-10-12: 10 mL

## 2015-10-12 MED ORDER — ONDANSETRON HCL 4 MG/2ML IJ SOLN
INTRAMUSCULAR | Status: DC | PRN
  Administered 2015-10-12: 4 mg via INTRAVENOUS

## 2015-10-12 MED ORDER — DIAZEPAM 2 MG PO TABS: 2.0000 mg | ORAL_TABLET | Freq: Three times a day (TID) | ORAL | Status: DC | PRN

## 2015-10-12 MED ORDER — POLYETHYLENE GLYCOL 3350 17 G PO PACK: 17.0000 g | PACK | Freq: Every day | ORAL | Status: DC | PRN

## 2015-10-12 MED ORDER — HYDROMORPHONE HCL 2 MG PO TABS: ORAL_TABLET | ORAL | 0 refills | Status: DC

## 2015-10-12 MED ORDER — OXYCODONE HCL 5 MG PO TABS
ORAL_TABLET | ORAL | Status: AC
  Filled 2015-10-12: qty 1

## 2015-10-12 MED ORDER — BUPIVACAINE-EPINEPHRINE (PF) 0.5% -1:200000 IJ SOLN
INTRAMUSCULAR | Status: AC
  Filled 2015-10-12: qty 30

## 2015-10-12 MED ORDER — OXYCODONE HCL 5 MG PO TABS
5.0000 mg | ORAL_TABLET | Freq: Once | ORAL | Status: AC | PRN
  Administered 2015-10-12: 5 mg via ORAL

## 2015-10-12 MED ORDER — CHLORHEXIDINE GLUCONATE 4 % EX LIQD: 60.0000 mL | Freq: Once | CUTANEOUS | Status: DC

## 2015-10-12 MED ORDER — LIDOCAINE HCL (CARDIAC) 20 MG/ML IV SOLN
INTRAVENOUS | Status: DC | PRN
  Administered 2015-10-12: 50 mg via INTRAVENOUS

## 2015-10-12 MED ORDER — MEPERIDINE HCL 25 MG/ML IJ SOLN: 6.2500 mg | INTRAMUSCULAR | Status: DC | PRN

## 2015-10-12 MED ORDER — SODIUM CHLORIDE 0.9 % IR SOLN
Status: DC | PRN
  Administered 2015-10-12: 1000 mL

## 2015-10-12 MED ORDER — ASPIRIN EC 325 MG PO TBEC: 325.0000 mg | DELAYED_RELEASE_TABLET | Freq: Every day | ORAL | 0 refills | Status: DC

## 2015-10-12 MED ORDER — ACETAMINOPHEN 650 MG RE SUPP: 650.0000 mg | Freq: Four times a day (QID) | RECTAL | Status: DC | PRN

## 2015-10-12 MED ORDER — HYDROMORPHONE HCL 1 MG/ML IJ SOLN
INTRAMUSCULAR | Status: AC
  Administered 2015-10-12: 0.5 mg via INTRAVENOUS
  Filled 2015-10-12: qty 1

## 2015-10-12 MED ORDER — LACTATED RINGERS IV SOLN
INTRAVENOUS | Status: DC | PRN
  Administered 2015-10-12 (×2): via INTRAVENOUS

## 2015-10-12 MED ORDER — BUPIVACAINE LIPOSOME 1.3 % IJ SUSP
20.0000 mL | INTRAMUSCULAR | Status: AC
  Administered 2015-10-12: 20 mL
  Filled 2015-10-12: qty 20

## 2015-10-12 MED ORDER — DIPHENHYDRAMINE HCL 12.5 MG/5ML PO ELIX: 12.5000 mg | ORAL_SOLUTION | ORAL | Status: DC | PRN

## 2015-10-12 MED ORDER — FENTANYL CITRATE (PF) 100 MCG/2ML IJ SOLN
INTRAMUSCULAR | Status: AC
  Filled 2015-10-12: qty 2

## 2015-10-12 MED ORDER — MAGNESIUM CITRATE PO SOLN: 1.0000 | Freq: Once | ORAL | Status: DC | PRN

## 2015-10-12 MED ORDER — CEFAZOLIN IN D5W 1 GM/50ML IV SOLN
1.0000 g | Freq: Four times a day (QID) | INTRAVENOUS | Status: AC
  Administered 2015-10-12 (×2): 1 g via INTRAVENOUS
  Filled 2015-10-12 (×2): qty 50

## 2015-10-12 MED ORDER — OXYCODONE HCL 5 MG/5ML PO SOLN
5.0000 mg | Freq: Once | ORAL | Status: AC | PRN
Start: 2015-10-12 — End: 2015-10-12

## 2015-10-12 MED ORDER — ONDANSETRON HCL 4 MG/2ML IJ SOLN
4.0000 mg | Freq: Four times a day (QID) | INTRAMUSCULAR | Status: DC | PRN
  Administered 2015-10-12: 4 mg via INTRAVENOUS
  Filled 2015-10-12: qty 2

## 2015-10-12 MED ORDER — DOCUSATE SODIUM 100 MG PO CAPS
100.0000 mg | ORAL_CAPSULE | Freq: Two times a day (BID) | ORAL | Status: DC
  Administered 2015-10-12 – 2015-10-13 (×2): 100 mg via ORAL
  Filled 2015-10-12 (×2): qty 1

## 2015-10-12 MED ORDER — HYDROMORPHONE HCL 2 MG PO TABS
2.0000 mg | ORAL_TABLET | ORAL | Status: DC | PRN
  Administered 2015-10-13: 2 mg via ORAL
  Filled 2015-10-12: qty 1

## 2015-10-12 MED ORDER — HYDROMORPHONE HCL 1 MG/ML IJ SOLN
INTRAMUSCULAR | Status: AC
  Administered 2015-10-12: 0.25 mg via INTRAVENOUS
  Filled 2015-10-12: qty 1

## 2015-10-12 MED ORDER — ALUM & MAG HYDROXIDE-SIMETH 200-200-20 MG/5ML PO SUSP: 30.0000 mL | ORAL | Status: DC | PRN

## 2015-10-12 MED ORDER — BISACODYL 10 MG RE SUPP
10.0000 mg | Freq: Every day | RECTAL | Status: DC | PRN
Start: 2015-10-12 — End: 2015-10-13

## 2015-10-12 MED ORDER — CELECOXIB 200 MG PO CAPS
200.0000 mg | ORAL_CAPSULE | Freq: Two times a day (BID) | ORAL | Status: DC
  Administered 2015-10-12 – 2015-10-13 (×2): 200 mg via ORAL
  Filled 2015-10-12 (×2): qty 1

## 2015-10-12 MED ORDER — LACTATED RINGERS IV SOLN
INTRAVENOUS | Status: DC
  Administered 2015-10-12: 09:00:00 via INTRAVENOUS

## 2015-10-12 MED ORDER — ONDANSETRON HCL 4 MG PO TABS
4.0000 mg | ORAL_TABLET | Freq: Four times a day (QID) | ORAL | Status: DC | PRN
  Administered 2015-10-13: 4 mg via ORAL
  Filled 2015-10-12: qty 1

## 2015-10-12 MED ORDER — ZOLPIDEM TARTRATE 5 MG PO TABS: 5.0000 mg | ORAL_TABLET | Freq: Every evening | ORAL | Status: DC | PRN

## 2015-10-12 MED ORDER — POTASSIUM CHLORIDE IN NACL 20-0.9 MEQ/L-% IV SOLN
INTRAVENOUS | Status: DC
  Administered 2015-10-12: 17:00:00 via INTRAVENOUS
  Filled 2015-10-12: qty 1000

## 2015-10-12 MED ORDER — DEXAMETHASONE SODIUM PHOSPHATE 10 MG/ML IJ SOLN
INTRAMUSCULAR | Status: DC | PRN
  Administered 2015-10-12: 4 mg via INTRAVENOUS

## 2015-10-12 MED ORDER — MENTHOL 3 MG MT LOZG: 1.0000 | LOZENGE | OROMUCOSAL | Status: DC | PRN

## 2015-10-12 MED ORDER — HYDROMORPHONE HCL 1 MG/ML IJ SOLN: 0.5000 mg | INTRAMUSCULAR | Status: DC | PRN

## 2015-10-12 MED ORDER — PNEUMOCOCCAL VAC POLYVALENT 25 MCG/0.5ML IJ INJ
0.5000 mL | INJECTION | INTRAMUSCULAR | Status: AC
  Administered 2015-10-13: 0.5 mL via INTRAMUSCULAR
  Filled 2015-10-12: qty 0.5

## 2015-10-12 MED ORDER — SODIUM CHLORIDE 0.9 % IR SOLN
Status: DC | PRN
  Administered 2015-10-12 (×2): 1000 mL

## 2015-10-12 MED ORDER — ATORVASTATIN CALCIUM 10 MG PO TABS
10.0000 mg | ORAL_TABLET | Freq: Every day | ORAL | Status: DC
  Filled 2015-10-12: qty 1

## 2015-10-12 MED ORDER — HYDROMORPHONE HCL 1 MG/ML IJ SOLN
0.2500 mg | INTRAMUSCULAR | Status: DC | PRN
  Administered 2015-10-12 (×2): 0.5 mg via INTRAVENOUS
  Administered 2015-10-12: 0.25 mg via INTRAVENOUS
  Administered 2015-10-12: 0.5 mg via INTRAVENOUS
  Administered 2015-10-12: 0.25 mg via INTRAVENOUS

## 2015-10-12 MED ORDER — ASPIRIN EC 325 MG PO TBEC
325.0000 mg | DELAYED_RELEASE_TABLET | Freq: Every day | ORAL | Status: DC
  Administered 2015-10-13: 325 mg via ORAL
  Filled 2015-10-12: qty 1

## 2015-10-12 MED ORDER — ACETAMINOPHEN 325 MG PO TABS: 650.0000 mg | ORAL_TABLET | Freq: Four times a day (QID) | ORAL | Status: DC | PRN

## 2015-10-12 MED ORDER — METOCLOPRAMIDE HCL 5 MG PO TABS: 5.0000 mg | ORAL_TABLET | Freq: Three times a day (TID) | ORAL | Status: DC | PRN

## 2015-10-12 MED ORDER — ONDANSETRON HCL 4 MG PO TABS: 4.0000 mg | ORAL_TABLET | Freq: Three times a day (TID) | ORAL | 0 refills | Status: DC | PRN

## 2015-10-12 MED ORDER — BISACODYL 5 MG PO TBEC: 5.0000 mg | DELAYED_RELEASE_TABLET | Freq: Every day | ORAL | 0 refills | Status: DC | PRN

## 2015-10-12 MED ORDER — PROPOFOL 10 MG/ML IV BOLUS
INTRAVENOUS | Status: AC
  Filled 2015-10-12: qty 40

## 2015-10-12 MED ORDER — ONDANSETRON HCL 4 MG/2ML IJ SOLN: 4.0000 mg | Freq: Once | INTRAMUSCULAR | Status: DC | PRN

## 2015-10-12 MED ORDER — PHENOL 1.4 % MT LIQD: 1.0000 | OROMUCOSAL | Status: DC | PRN

## 2015-10-12 MED ORDER — METOCLOPRAMIDE HCL 5 MG/ML IJ SOLN
5.0000 mg | Freq: Three times a day (TID) | INTRAMUSCULAR | Status: DC | PRN
  Administered 2015-10-12: 10 mg via INTRAVENOUS
  Filled 2015-10-12: qty 2

## 2015-10-12 MED ORDER — FENTANYL CITRATE (PF) 100 MCG/2ML IJ SOLN
INTRAMUSCULAR | Status: DC | PRN
  Administered 2015-10-12: 100 ug via INTRAVENOUS
  Administered 2015-10-12: 25 ug via INTRAVENOUS
  Administered 2015-10-12: 50 ug via INTRAVENOUS
  Administered 2015-10-12: 25 ug via INTRAVENOUS

## 2015-10-12 MED ORDER — DEXAMETHASONE SODIUM PHOSPHATE 10 MG/ML IJ SOLN
10.0000 mg | Freq: Once | INTRAMUSCULAR | Status: AC
  Administered 2015-10-13: 10 mg via INTRAVENOUS
  Filled 2015-10-12: qty 1

## 2015-10-12 MED ORDER — MIDAZOLAM HCL 2 MG/2ML IJ SOLN
INTRAMUSCULAR | Status: AC
  Filled 2015-10-12: qty 2

## 2015-10-12 SURGICAL SUPPLY — 67 items
APL SKNCLS STERI-STRIP NONHPOA (GAUZE/BANDAGES/DRESSINGS) ×1
BANDAGE ELASTIC 4 VELCRO ST LF (GAUZE/BANDAGES/DRESSINGS) ×3 IMPLANT
BANDAGE ELASTIC 6 VELCRO ST LF (GAUZE/BANDAGES/DRESSINGS) ×3 IMPLANT
BANDAGE ESMARK 6X9 LF (GAUZE/BANDAGES/DRESSINGS) ×1 IMPLANT
BENZOIN TINCTURE PRP APPL 2/3 (GAUZE/BANDAGES/DRESSINGS) ×3 IMPLANT
BLADE SAG 18X100X1.27 (BLADE) ×6 IMPLANT
BNDG CMPR 9X6 STRL LF SNTH (GAUZE/BANDAGES/DRESSINGS) ×1
BNDG ESMARK 6X9 LF (GAUZE/BANDAGES/DRESSINGS) ×3
BOWL SMART MIX CTS (DISPOSABLE) ×3 IMPLANT
CAPT KNEE TOTAL 3 ×2 IMPLANT
CEMENT BONE SIMPLEX SPEEDSET (Cement) ×6 IMPLANT
CLOSURE WOUND 1/2 X4 (GAUZE/BANDAGES/DRESSINGS) ×1
COVER SURGICAL LIGHT HANDLE (MISCELLANEOUS) ×3 IMPLANT
CUFF TOURNIQUET SINGLE 34IN LL (TOURNIQUET CUFF) ×3 IMPLANT
DRAPE EXTREMITY T 121X128X90 (DRAPE) ×3 IMPLANT
DRAPE IMP U-DRAPE 54X76 (DRAPES) ×3 IMPLANT
DRAPE PROXIMA HALF (DRAPES) ×3 IMPLANT
DRAPE U-SHAPE 47X51 STRL (DRAPES) ×3 IMPLANT
DRSG PAD ABDOMINAL 8X10 ST (GAUZE/BANDAGES/DRESSINGS) ×3 IMPLANT
DURAPREP 26ML APPLICATOR (WOUND CARE) ×4 IMPLANT
ELECT CAUTERY BLADE 6.4 (BLADE) ×3 IMPLANT
ELECT REM PT RETURN 9FT ADLT (ELECTROSURGICAL) ×3
ELECTRODE REM PT RTRN 9FT ADLT (ELECTROSURGICAL) ×1 IMPLANT
EVACUATOR 1/8 PVC DRAIN (DRAIN) ×1 IMPLANT
FACESHIELD WRAPAROUND (MASK) ×6 IMPLANT
FACESHIELD WRAPAROUND OR TEAM (MASK) ×2 IMPLANT
GAUZE SPONGE 4X4 12PLY STRL (GAUZE/BANDAGES/DRESSINGS) ×3 IMPLANT
GLOVE BIOGEL PI IND STRL 7.0 (GLOVE) ×1 IMPLANT
GLOVE BIOGEL PI INDICATOR 7.0 (GLOVE) ×2
GLOVE ORTHO TXT STRL SZ7.5 (GLOVE) ×3 IMPLANT
GLOVE SURG ORTHO 7.0 STRL STRW (GLOVE) ×1 IMPLANT
GOWN STRL REUS W/ TWL LRG LVL3 (GOWN DISPOSABLE) ×2 IMPLANT
GOWN STRL REUS W/ TWL XL LVL3 (GOWN DISPOSABLE) ×1 IMPLANT
GOWN STRL REUS W/TWL LRG LVL3 (GOWN DISPOSABLE) ×3
GOWN STRL REUS W/TWL XL LVL3 (GOWN DISPOSABLE) ×9
HANDPIECE INTERPULSE COAX TIP (DISPOSABLE) ×3
IMMOBILIZER KNEE 22 UNIV (SOFTGOODS) ×3 IMPLANT
IMMOBILIZER KNEE 24 THIGH 36 (MISCELLANEOUS) IMPLANT
IMMOBILIZER KNEE 24 UNIV (MISCELLANEOUS)
KIT BASIN OR (CUSTOM PROCEDURE TRAY) ×3 IMPLANT
KIT ROOM TURNOVER OR (KITS) ×3 IMPLANT
MANIFOLD NEPTUNE II (INSTRUMENTS) ×3 IMPLANT
NDL 18GX1X1/2 (RX/OR ONLY) (NEEDLE) ×1 IMPLANT
NDL HYPO 25GX1X1/2 BEV (NEEDLE) ×1 IMPLANT
NEEDLE 18GX1X1/2 (RX/OR ONLY) (NEEDLE) ×3 IMPLANT
NEEDLE HYPO 25GX1X1/2 BEV (NEEDLE) ×3 IMPLANT
NS IRRIG 1000ML POUR BTL (IV SOLUTION) ×3 IMPLANT
PACK TOTAL JOINT (CUSTOM PROCEDURE TRAY) ×3 IMPLANT
PACK UNIVERSAL I (CUSTOM PROCEDURE TRAY) ×3 IMPLANT
PAD ARMBOARD 7.5X6 YLW CONV (MISCELLANEOUS) ×6 IMPLANT
PAD CAST 4YDX4 CTTN HI CHSV (CAST SUPPLIES) ×1 IMPLANT
PADDING CAST COTTON 4X4 STRL (CAST SUPPLIES) ×3
SET HNDPC FAN SPRY TIP SCT (DISPOSABLE) ×1 IMPLANT
STRIP CLOSURE SKIN 1/2X4 (GAUZE/BANDAGES/DRESSINGS) ×3 IMPLANT
SUCTION FRAZIER HANDLE 10FR (MISCELLANEOUS) ×2
SUCTION TUBE FRAZIER 10FR DISP (MISCELLANEOUS) ×1 IMPLANT
SUT MNCRL AB 4-0 PS2 18 (SUTURE) ×3 IMPLANT
SUT VIC AB 0 CT1 27 (SUTURE) ×6
SUT VIC AB 0 CT1 27XBRD ANBCTR (SUTURE) IMPLANT
SUT VIC AB 1 CTX 36 (SUTURE) ×6
SUT VIC AB 1 CTX36XBRD ANBCTR (SUTURE) ×1 IMPLANT
SUT VIC AB 2-0 CT1 27 (SUTURE) ×6
SUT VIC AB 2-0 CT1 TAPERPNT 27 (SUTURE) ×2 IMPLANT
SYR 50ML LL SCALE MARK (SYRINGE) ×3 IMPLANT
SYR CONTROL 10ML LL (SYRINGE) ×3 IMPLANT
TOWEL OR 17X24 6PK STRL BLUE (TOWEL DISPOSABLE) ×3 IMPLANT
TOWEL OR 17X26 10 PK STRL BLUE (TOWEL DISPOSABLE) ×3 IMPLANT

## 2015-10-12 NOTE — H&P (View-Only) (Signed)
TOTAL KNEE ADMISSION H&P  Patient is being admitted for left total knee arthroplasty.  Subjective:  Chief Complaint:left knee pain.  HPI: Lauren Montgomery, 70 y.o. female, has a history of pain and functional disability in the left knee due to arthritis and has failed non-surgical conservative treatments for greater than 12 weeks to includeNSAID's and/or analgesics, corticosteriod injections and activity modification.  Onset of symptoms was abrupt, starting 1 years ago with rapidlly worsening course since that time. The patient noted no past surgery on the left knee(s).  Patient currently rates pain in the left knee(s) at 7 out of 10 with activity. Patient has night pain, worsening of pain with activity and weight bearing and pain that interferes with activities of daily living.  Patient has evidence of subchondral cysts by imaging study. There is no active infection.  Patient Active Problem List   Diagnosis Date Noted  . DJD (degenerative joint disease) of knee 03/24/2014  . Primary localized osteoarthrosis, pelvic region and thigh 09/02/2013   Past Medical History:  Diagnosis Date  . Blood transfusion    with childbirth  . Cataract    left eye  . Degenerative joint disease   . Osteoarthritis   . PONV (postoperative nausea and vomiting)    denies,   . Skin cancer    nose    Past Surgical History:  Procedure Laterality Date  . BONE ANCHORED HEARING AID IMPLANT Left   . BUNIONECTOMY Bilateral    feet  . CARPAL TUNNEL RELEASE     right hand  . JOINT REPLACEMENT    . TOTAL HIP ARTHROPLASTY  03/14/2011   Procedure: TOTAL HIP ARTHROPLASTY;  Surgeon: [name], MD;  Location: [place];  Service: Orthopedics;  Laterality: Left;  120 MINUTES FOR THIS SURGERY  . TOTAL HIP ARTHROPLASTY Right 09/02/2013   Procedure: TOTAL HIP ARTHROPLASTY ANTERIOR APPROACH: Disolacted trial head resection muscular abdominal inguinal ligament.;  Surgeon: [name], MD;  Location: [place];  Service:  Orthopedics;  Laterality: Right;  . TOTAL KNEE ARTHROPLASTY Right 03/24/2014   Procedure: RIGHT TOTAL KNEE ARTHROPLASTY;  Surgeon: [name], MD;  Location: [place];  Service: Orthopedics;  Laterality: Right;  . TUBAL LIGATION       (Not in a hospital admission) Allergies  Allergen Reactions  . Codeine Nausea Only    Social History  Substance Use Topics  . Smoking status: Never Smoker  . Smokeless tobacco: Never Used  . Alcohol use No    Family History  Problem Relation Age of Onset  . Adopted: Yes     Review of Systems  Constitutional: Negative.   HENT: Positive for hearing loss.   Eyes: Negative.   Respiratory: Negative.   Cardiovascular: Negative.   Gastrointestinal: Negative.   Genitourinary: Negative.   Musculoskeletal: Positive for joint pain.  Skin: Negative.   Neurological: Positive for headaches. Negative for dizziness, tingling, tremors, sensory change, speech change, focal weakness, seizures and loss of consciousness.  Endo/Heme/Allergies: Negative.   Psychiatric/Behavioral: Negative.     Objective:  Physical Exam  Constitutional: She is oriented to person, place, and time. She appears well-developed and well-nourished.  HENT:  Head: Normocephalic and atraumatic.  Eyes: EOM are normal. Pupils are equal, round, and reactive to light.  Neck: Normal range of motion. Neck supple.  Cardiovascular: Normal rate and regular rhythm.   Respiratory: Effort normal and breath sounds normal.  GI: Soft. Bowel sounds are normal.  Musculoskeletal:  Motion 0-120.  On the left  she has varus alignment.  Partially correctable.  Motion 0-100 with tibiofemoral and patellofemoral crepitus.  Neurovascularly intact distally.    Neurological: She is alert and oriented to person, place, and time.  Skin: Skin is warm and dry.  Psychiatric: She has a normal mood and affect. Her behavior is normal. Judgment and thought content normal.    Vital signs in last 24  hours: @VSRANGES @  Labs:   Estimated body mass index is 27.22 kg/m as calculated from the following:   Height as of 03/24/14: 5\' 2"  (1.575 m).   Weight as of 03/24/14: 67.5 kg (148 lb 13 oz).   Imaging Review Plain radiographs demonstrate severe degenerative joint disease of the left knee(s). The overall alignment ismild varus. The bone quality appears to be fair for age and reported activity level.  Assessment/Plan:  End stage arthritis, left knee   The patient history, physical examination, clinical judgment of the provider and imaging studies are consistent with end stage degenerative joint disease of the left knee(s) and total knee arthroplasty is deemed medically necessary. The treatment options including medical management, injection therapy arthroscopy and arthroplasty were discussed at length. The risks and benefits of total knee arthroplasty were presented and reviewed. The risks due to aseptic loosening, infection, stiffness, patella tracking problems, thromboembolic complications and other imponderables were discussed. The patient acknowledged the explanation, agreed to proceed with the plan and consent was signed. Patient is being admitted for inpatient treatment for surgery, pain control, PT, OT, prophylactic antibiotics, VTE prophylaxis, progressive ambulation and ADL's and discharge planning. The patient is planning to be discharged home with home health services

## 2015-10-12 NOTE — Progress Notes (Signed)
Orthopedic Tech Progress Note Patient Details:  Lauren Montgomery 16-Mar-1945 [id number]  CPM Left Knee CPM Left Knee: On Left Knee Flexion (Degrees): 90 Left Knee Extension (Degrees): 0 Additional Comments: trapeze bar patient helper   Hildred Priest 10/12/2015, 12:56 PM Viewed order from doctor's order list

## 2015-10-12 NOTE — Anesthesia Procedure Notes (Signed)
Procedure Name: LMA Insertion Date/Time: 10/12/2015 10:02 AM Performed by: Eligha Bridegroom Pre-anesthesia Checklist: Patient identified, Suction available, Emergency Drugs available, Patient being monitored and Timeout performed Patient Re-evaluated:Patient Re-evaluated prior to inductionOxygen Delivery Method: Circle system utilized Preoxygenation: Pre-oxygenation with 100% oxygen Intubation Type: IV induction LMA Size: 4.0 Placement Confirmation: positive ETCO2 and breath sounds checked- equal and bilateral Dental Injury: Teeth and Oropharynx as per pre-operative assessment

## 2015-10-12 NOTE — Interval H&P Note (Signed)
History and Physical Interval Note:  10/12/2015 8:31 AM  Lauren Montgomery  has presented today for surgery, with the diagnosis of djd left knee  The various methods of treatment have been discussed with the patient and family. After consideration of risks, benefits and other options for treatment, the patient has consented to  Procedure(s): TOTAL KNEE ARTHROPLASTY (Left) as a surgical intervention .  The patient's history has been reviewed, patient examined, no change in status, stable for surgery.  I have reviewed the patient's chart and labs.  Questions were answered to the patient's satisfaction.     [name]

## 2015-10-12 NOTE — Anesthesia Postprocedure Evaluation (Signed)
Anesthesia Post Note  Patient: Dutch Gray  Procedure(s) Performed: Procedure(s) (LRB): TOTAL KNEE ARTHROPLASTY (Left)  Patient location during evaluation: PACU Anesthesia Type: General Level of consciousness: awake and alert Pain management: pain level controlled Vital Signs Assessment: post-procedure vital signs reviewed and stable Respiratory status: spontaneous breathing, nonlabored ventilation, respiratory function stable and patient connected to nasal cannula oxygen Cardiovascular status: blood pressure returned to baseline and stable Postop Assessment: no signs of nausea or vomiting Anesthetic complications: no    Last Vitals:  Vitals:   10/12/15 1227 10/12/15 1230  BP: 140/74 140/74  Pulse: 63 73  Resp: 11 (!) 24  Temp:      Last Pain:  Vitals:   10/12/15 1227  TempSrc:   PainSc: 9     LLE Motor Response: Purposeful movement;Responds to commands (10/12/15 1230) LLE Sensation: Full sensation (10/12/15 1230) RLE Motor Response: Purposeful movement;Responds to commands (10/12/15 1230) RLE Sensation: Full sensation (10/12/15 1230)      Donyetta Ogletree A

## 2015-10-12 NOTE — Op Note (Signed)
[name], [name] NO.:  [id number]  MEDICAL RECORD NO.:  [id number]  LOCATION:  [facility]                         FACILITY:  [facility]  PHYSICIAN:  Lauren Montgomery, M.D. DATE OF BIRTH:  May 21, 1945  DATE OF PROCEDURE:  10/12/2015 DATE OF DISCHARGE:                              OPERATIVE REPORT   PREOPERATIVE DIAGNOSES:  Left knee end-stage arthritis, primary generalized.  Flexion contracture.  POSTOPERATIVE DIAGNOSES:  Left knee end-stage arthritis, primary generalized.  Flexion contracture.  PROCEDURES:  Left knee modified minimally-invasive total knee replacement with Stryker triathlon prosthesis.  Cemented pegged cruciate- retaining #3 femoral component.  Cemented #3 tibial component and 9-mm CS insert.  Cemented resurfacing 32-mm patellar component.  SURGEON:  Lauren Montgomery, M.D.  ASSISTANT:  Elmyra Ricks, P.A., present throughout the entire case and necessary for timely completion of procedure.  ANESTHESIA:  General.  BLOOD LOSS:  Minimal.  SPECIMENS:  None.  CULTURES:  None.  COMPLICATIONS:  None.  DRESSINGS:  Soft compressive knee immobilizer.  TOURNIQUET TIME:  50 minutes.  DESCRIPTION OF PROCEDURE:  The patient was brought to the operating room and after adequate anesthesia had been obtained, tourniquet applied, prepped and draped in usual sterile fashion.  Exsanguinated with elevation of Esmarch.  Tourniquet inflated to 350 mmHg.  Straight incision above the patella down to tibial tubercle.  Medial arthrotomy, vastus splitting.  Knee exposed.  Because of flexion contracture, I did a 10-mm resection, distal femur with flexible intramedullary guide. Using epicondylar axis, the femur was sized, cut and fitted for a pegged cruciate retaining #3 component.  Extramedullary guide, proximal tibial resection with 3-degree posterior slope cut.  Sized with #3 component. Rotation was set with trials and tibia hand reamed.  A 9-mm CS  insert. Resurfacing the patella with 32-mm component after a 10-mm resection. At completion, very pleased with biomechanical axis, stability, tracking and balancing.  All trials had been removed.  Copious irrigation with pulse irrigating device.  Cement prepared and placed on all components, firmly seated.  Polyethylene attached to tibia, knee reduced.  Patella held with a clamp.  Once the cement hardened, the knee was irrigated again.  Soft tissue was injected with Exparel. Arthrotomy closed with Vicryl.  Subcutaneous and subcuticular closure. Margins were injected with Marcaine.  Sterile compressive dressing applied.  Tourniquet was deflated and removed.  Anesthesia reversed. Brought to the recovery room.  Tolerated the surgery well.  No complications.     Lauren Montgomery, M.D.     DFM/MEDQ  D:  10/12/2015  T:  10/12/2015  Job:  [id number]

## 2015-10-12 NOTE — Transfer of Care (Signed)
Immediate Anesthesia Transfer of Care Note  Patient: Lauren Montgomery  Procedure(s) Performed: Procedure(s): TOTAL KNEE ARTHROPLASTY (Left)  Patient Location: PACU  Anesthesia Type:MAC  Level of Consciousness: awake, alert  and oriented  Airway & Oxygen Therapy: Patient Spontanous Breathing and Patient connected to nasal cannula oxygen  Post-op Assessment: Report given to RN and Post -op Vital signs reviewed and stable  Post vital signs: Reviewed and stable  Last Vitals:  Vitals:   10/12/15 0803  BP: (!) 156/76  Pulse: 62  Resp: 18  Temp: 36.6 C    Last Pain:  Vitals:   10/12/15 0803  TempSrc: Oral         Complications: No apparent anesthesia complications

## 2015-10-12 NOTE — Discharge Summary (Addendum)
Patient ID: Lauren Montgomery MRN: [id number] DOB/AGE: [birth year] 70 y.o.  Admit date: 10/12/2015 Discharge date: 10/12/2015  Admission Diagnoses:  Active Problems:   Primary localized osteoarthritis of left knee   Discharge Diagnoses:  Same  Past Medical History:  Diagnosis Date  . Blood transfusion    with childbirth  . Cataract    left eye  . Degenerative joint disease   . Osteoarthritis   . PONV (postoperative nausea and vomiting)    denies,   . Skin cancer    nose    Surgeries: Procedure(s): TOTAL KNEE ARTHROPLASTY on 10/12/2015   Consultants:   Discharged Condition: Improved  Hospital Course: Lauren Montgomery is an 70 y.o. female who was admitted 10/12/2015 for operative treatment of primary localized osteoarthritis left knee. Patient has severe unremitting pain that affects sleep, daily activities, and work/hobbies. After pre-op clearance the patient was taken to the operating room on 10/12/2015 and underwent  Procedure(s): TOTAL KNEE ARTHROPLASTY.  Patient with a pre-op Hb of 13.3 developed ABLA on pod #1 with  Hb of 11.0.  She is currently stable but we will continue to follow.  Patient was given perioperative antibiotics: Anti-infectives    Start     Dose/Rate Route Frequency Ordered Stop   10/12/15 0930  ceFAZolin (ANCEF) IVPB 2g/100 mL premix     2 g 200 mL/hr over 30 Minutes Intravenous To ShortStay Surgical 10/11/15 0949 10/12/15 1000       Patient was given sequential compression devices, early ambulation, and chemoprophylaxis to prevent DVT.  Patient benefited maximally from hospital stay and there were no complications.    Recent vital signs: Patient Vitals for the past 24 hrs:  BP Temp Temp src Pulse Resp SpO2 Weight  10/12/15 1230 140/74 - - 73 (!) 24 100 % -  10/12/15 1227 140/74 - - 63 11 100 % -  10/12/15 1215 104/82 - - 79 16 100 % -  10/12/15 1200 (!) 144/76 - - 76 14 99 % -  10/12/15 1156 - - - 78 16 99 % -  10/12/15 1147 134/80 - - 85 18 98 %  -  10/12/15 1130 135/88 - - 94 13 94 % -  10/12/15 1127 135/88 97.8 F (36.6 C) - 97 - 94 % -  10/12/15 0803 (!) 156/76 97.8 F (36.6 C) Oral 62 18 98 % 70.3 kg (155 lb)     Recent laboratory studies: No results for input(s): WBC, HGB, HCT, PLT, NA, K, CL, CO2, BUN, CREATININE, GLUCOSE, INR, CALCIUM in the last 72 hours.  Invalid input(s): PT, 2   Discharge Medications:     Medication List    STOP taking these medications   acetaminophen 500 MG tablet Commonly known as:  TYLENOL     TAKE these medications   aspirin EC 325 MG tablet Take 1 tablet (325 mg total) by mouth daily. 1 tab a day for the next 30 days to prevent blood clots   atorvastatin 10 MG tablet Commonly known as:  LIPITOR Take 10 mg by mouth daily.   bisacodyl 5 MG EC tablet Commonly known as:  DULCOLAX Take 1 tablet (5 mg total) by mouth daily as needed for moderate constipation.   HYDROmorphone 2 MG tablet Commonly known as:  DILAUDID Take 1-2 tabs po q4-6 hours prn pain   ondansetron 4 MG tablet Commonly known as:  ZOFRAN Take 1 tablet (4 mg total) by mouth every 8 (eight) hours as needed for nausea or vomiting.  Vitamin D 2000 units tablet Take 2,000 Units by mouth daily.       Diagnostic Studies: Dg Knee Left Port  Result Date: 10/12/2015 CLINICAL DATA:  Total knee replacement. EXAM: PORTABLE LEFT KNEE - 1-2 VIEW COMPARISON:  None. FINDINGS: Total knee replacement on the left. Components appear well positioned. No radiographically detectable complication. IMPRESSION: Good appearance following total knee replacement on the left. Electronically Signed   By: Nelson Chimes M.D.   On: 10/12/2015 12:37    Disposition: 01-Home or Self Care    Follow-up Information    [name], MD. Schedule an appointment as soon as possible for a visit in 2 week(s).   Specialty:  Orthopedic Surgery Contact information: [address] [place] [address] [phone]             Signed: Fannie Knee 10/12/2015, 12:52 PM

## 2015-10-12 NOTE — Addendum Note (Signed)
Addendum  created 10/12/15 1307 by Eligha Bridegroom, CRNA   Anesthesia Intra Meds edited

## 2015-10-12 NOTE — Anesthesia Preprocedure Evaluation (Signed)
Anesthesia Evaluation  Patient identified by MRN, date of birth, ID band Patient awake    Reviewed: Allergy & Precautions, NPO status , Patient's Chart, lab work & pertinent test results  History of Anesthesia Complications (+) PONV  Airway Mallampati: I  TM Distance: >3 FB Neck ROM: Full    Dental  (+) Teeth Intact, Dental Advisory Given   Pulmonary  breath sounds clear to auscultation        Cardiovascular Rhythm:Regular Rate:Normal     Neuro/Psych    GI/Hepatic   Endo/Other    Renal/GU      Musculoskeletal   Abdominal   Peds  Hematology   Anesthesia Other Findings   Reproductive/Obstetrics                             Anesthesia Physical Anesthesia Plan  ASA: II  Anesthesia Plan: General   Post-op Pain Management:    Induction: Intravenous  Airway Management Planned: LMA  Additional Equipment:   Intra-op Plan:   Post-operative Plan: Extubation in OR  Informed Consent: I have reviewed the patients History and Physical, chart, labs and discussed the procedure including the risks, benefits and alternatives for the proposed anesthesia with the patient or authorized representative who has indicated his/her understanding and acceptance.   Dental advisory given  Plan Discussed with: CRNA, Anesthesiologist and Surgeon  Anesthesia Plan Comments:         Anesthesia Quick Evaluation  

## 2015-10-13 ENCOUNTER — Encounter (HOSPITAL_COMMUNITY): Payer: Self-pay | Admitting: Orthopedic Surgery

## 2015-10-13 DIAGNOSIS — M1712 Unilateral primary osteoarthritis, left knee: Secondary | ICD-10-CM | POA: Diagnosis not present

## 2015-10-13 LAB — BASIC METABOLIC PANEL
ANION GAP: 10 (ref 5–15)
BUN: 13 mg/dL (ref 6–20)
CO2: 24 mmol/L (ref 22–32)
Calcium: 8.5 mg/dL — ABNORMAL LOW (ref 8.9–10.3)
Chloride: 106 mmol/L (ref 101–111)
Creatinine, Ser: 0.9 mg/dL (ref 0.44–1.00)
GFR calc Af Amer: >60 mL/min (ref >60)
Glucose, Bld: 131 mg/dL — ABNORMAL HIGH (ref 65–99)
POTASSIUM: 4.4 mmol/L (ref 3.5–5.1)
SODIUM: 140 mmol/L (ref 135–145)

## 2015-10-13 LAB — CBC
HCT: 34.3 % — ABNORMAL LOW (ref 36.0–46.0)
Hemoglobin: 11 g/dL — ABNORMAL LOW (ref 12.0–15.0)
MCH: 29.1 pg (ref 26.0–34.0)
MCHC: 32.1 g/dL (ref 30.0–36.0)
MCV: 90.7 fL (ref 78.0–100.0)
PLATELETS: 198 10*3/uL (ref 150–400)
RBC: 3.78 MIL/uL — AB (ref 3.87–5.11)
RDW: 13.9 % (ref 11.5–15.5)
WBC: 12.6 10*3/uL — AB (ref 4.0–10.5)

## 2015-10-13 NOTE — Progress Notes (Signed)
Subjective: 1 Day Post-Op Procedure(s) (LRB): TOTAL KNEE ARTHROPLASTY (Left) Patient reports pain as mild.  Patient reports vomitus episode last night.  None since.  No nausea.  No lightheadedness/dizziness, chest pain/sob.   Objective: Vital signs in last 24 hours: Temp:  [96.8 F (36 C)-99.4 F (37.4 C)] 99.4 F (37.4 C) (08/17 0507) Pulse Rate:  [54-97] 66 (08/17 0507) Resp:  [0-24] 16 (08/17 0507) BP: (95-156)/(49-88) 105/49 (08/17 0507) SpO2:  [93 %-100 %] 94 % (08/17 0507) Weight:  [70.3 kg (155 lb)] 70.3 kg (155 lb) (08/16 0803)  Intake/Output from previous day: 08/16 0701 - 08/17 0700 In: 1600 [P.O.:200; I.V.:1400] Out: 500 [Urine:500] Intake/Output this shift: No intake/output data recorded.   Recent Labs  10/13/15 0349  HGB 11.0*    Recent Labs  10/13/15 0349  WBC 12.6*  RBC 3.78*  HCT 34.3*  PLT 198    Recent Labs  10/13/15 0349  NA 140  K 4.4  CL 106  CO2 24  BUN 13  CREATININE 0.90  GLUCOSE 131*  CALCIUM 8.5*   No results for input(s): LABPT, INR in the last 72 hours.  Neurologically intact Neurovascular intact Sensation intact distally Intact pulses distally Dorsiflexion/Plantar flexion intact Compartment soft  Assessment/Plan: 1 Day Post-Op Procedure(s) (LRB): TOTAL KNEE ARTHROPLASTY (Left) Advance diet Up with therapy D/C IV fluids Discharge home with home health likely today as long as she progresses with PT WBAT LLE ABLA-mild and stable Please remove ACE bandage and apply ted hose to [place] for patient to advance diet to solid food this am  Fannie Knee 10/13/2015, 7:14 AM

## 2015-10-13 NOTE — Discharge Instructions (Signed)

## 2015-10-13 NOTE — Progress Notes (Signed)
Orthopedic Tech Progress Note Patient Details:  Lauren Montgomery 1946/02/16 [id number]  Patient ID: Lauren Montgomery, female   DOB: [birth year], 70 y.o.   MRN: [id number] Pt already on cpm  Karolee Stamps 10/13/2015, 5:34 AM

## 2015-10-13 NOTE — Care Management Note (Signed)
Case Management Note  Patient Details  Name: Lauren Montgomery MRN: [id number] Date of Birth: [birth year]  Subjective/Objective:  28 yr iold female s/p left total knee arthroplasty.                 Action/Plan: Case manager spoke with patient concerning home health and DME needs. Choice was offered for Home Health agency. Patient was preoperatively setup with [facility], no changes. She will have family support at discharge.  Expected Discharge Date:   10/13/15               Expected Discharge Plan:  [place]  In-House Referral:     Discharge planning Services  CM Consult  Post Acute Care Choice:  Home Health, Durable Medical Equipment Choice offered to:  Patient  DME Arranged:  3-N-1, Walker rolling, CPM DME Agency:  [employer]  HH Arranged:  PT [place]:  [facility] (now [facility])  Status of Service:  Completed, signed off  If discussed at [employer] of Stay Meetings, dates discussed:    Additional Comments:  Ninfa Meeker, RN 10/13/2015, 10:22 AM

## 2015-10-13 NOTE — Progress Notes (Addendum)
Pt ready for discharge. Education/instructions reviewed with pt and husband and all questions/concerns addressed. IV removed and belongings gathered. Pt will be transported out via wheelchair to husband's vehicle. Will continue to monitor   Addendum: Administered pneumonia vaccine to pt this AM. Screening from RN last night showed that pt had not been vaccinated within last 5 years and was over 65 thus requiring administration. As pt was being discharged informed by pt's husband that pt had already received pneumonia vaccine last year in July and "it caused her to get shingles". Notified Hart [place] with update and informed pt and her husband to call pt's PCP with an update. Will continue to monitor

## 2015-10-13 NOTE — Evaluation (Signed)
Occupational Therapy Evaluation and Discharge Patient Details Name: Lauren Montgomery MRN: [id number] DOB: 01-05-1946 Today's Date: 10/13/2015    History of Present Illness pt presents with L TKR.  pt with hx of bil THR, R TKR, L Cataract, and Hearing Aid Implant.     Clinical Impression   Pt is performing ADL and ADL transfers at a supervision to modified independent level. All education completed. Pt will have assist of her husband as needed.    Follow Up Recommendations  No OT follow up    Equipment Recommendations  None recommended by OT    Recommendations for Other Services       Precautions / Restrictions Precautions Precautions: Knee Precaution Comments: educated in no pillow under knee Restrictions Weight Bearing Restrictions: Yes LLE Weight Bearing: Weight bearing as tolerated      Mobility Bed Mobility Overal bed mobility: Modified Independent                Transfers Overall transfer level: Needs assistance Equipment used: Rolling walker (2 wheeled) Transfers: Sit to/from Stand Sit to Stand: Supervision         General transfer comment: Cues for UE use only.  pt able to perform without A.      Balance Overall balance assessment: Needs assistance Sitting-balance support: No upper extremity supported;Feet supported Sitting balance-Leahy Scale: Good     Standing balance support: Single extremity supported;Bilateral upper extremity supported;No upper extremity supported;During functional activity Standing balance-Leahy Scale: Fair                              ADL Overall ADL's : Needs assistance/impaired Eating/Feeding: Independent   Grooming: Wash/dry hands;Standing;Supervision/safety   Upper Body Bathing: Set up;Sitting   Lower Body Bathing: Supervison/ safety;Sit to/from stand;With adaptive equipment   Upper Body Dressing : Set up;Sitting   Lower Body Dressing: Supervision/safety;With adaptive equipment;Sit to/from  stand   Toilet Transfer: Supervision/safety;Ambulation;Comfort height toilet;RW   Toileting- Clothing Manipulation and Hygiene: Supervision/safety;Sit to/from Nurse, children's Details (indicate cue type and reason): plans to use tub bench and transition to walk in shower when she is stronger Functional mobility during ADLs: Supervision/safety;Rolling walker General ADL Comments: Pt has all necessary AE and DME.     Vision     Perception     Praxis      Pertinent Vitals/Pain Pain Assessment: Faces Faces Pain Scale: Hurts a little bit Pain Location: L knee Pain Descriptors / Indicators: Sore Pain Intervention(s): Monitored during session;Premedicated before session;Repositioned     Hand Dominance Right   Extremity/Trunk Assessment Upper Extremity Assessment Upper Extremity Assessment: Overall WFL for tasks assessed   Lower Extremity Assessment Lower Extremity Assessment: Defer to PT evaluation RLE Deficits / Details: Sensation intact, Strength WFL, AROM ~5 - 60   Cervical / Trunk Assessment Cervical / Trunk Assessment: Normal   Communication Communication Communication: No difficulties   Cognition Arousal/Alertness: Awake/alert Behavior During Therapy: WFL for tasks assessed/performed Overall Cognitive Status: Within Functional Limits for tasks assessed                     General Comments       Exercises Exercises: Total Joint     Shoulder Instructions      Home Living Family/patient expects to be discharged to:: Private residence Living Arrangements: Spouse/significant other Available Help at Discharge: Family;Available PRN/intermittently Type of Home: House Home Access: Stairs to enter [employer]  of Steps: 3 Entrance Stairs-Rails: Right Home Layout: One level     Bathroom Shower/Tub: Tub/shower unit;Walk-in shower   Bathroom Toilet: Standard     Home Equipment: Environmental consultant - 2 wheels;Cane - single point;Bedside  commode;Tub bench;Hand held shower head;Adaptive equipment Adaptive Equipment: Reacher;Sock aid;Long-handled shoe horn;Long-handled sponge        Prior Functioning/Environment Level of Independence: Independent             OT Diagnosis: Generalized weakness;Acute pain   OT Problem List:     OT Treatment/Interventions:      OT Goals(Current goals can be found in the care plan section) Acute Rehab OT Goals Patient Stated Goal: Home today.  OT Frequency:     Barriers to D/C:            Co-evaluation              End of Session Equipment Utilized During Treatment: Gait belt;Rolling walker CPM Left Knee CPM Left Knee: Off  Activity Tolerance: Patient tolerated treatment well Patient left: in bed;with call bell/phone within reach;with nursing/sitter in room   Time: [id number] OT Time Calculation (min): 15 min Charges:  OT General Charges $OT Visit: 1 Procedure OT Evaluation $OT Eval Low Complexity: 1 Procedure G-Codes:    Malka So 10/13/2015, 10:09 AM  [phone]

## 2015-10-13 NOTE — Evaluation (Signed)
Physical Therapy Evaluation Patient Details Name: Lauren Montgomery MRN: [id number] DOB: January 29, 1946 Today's Date: 10/13/2015   History of Present Illness  pt presents with L TKR.  pt with hx of bil THR, R TKR, L Cataract, and Hearing Aid Implant.    Clinical Impression  Pt mobilizing very well for POD #1.  Pt with all needed DME and very little needs from acute PT.  Feel pt is ready for D/C from PT stand point.      Follow Up Recommendations Home health PT;Supervision - Intermittent    Equipment Recommendations  None recommended by PT    Recommendations for Other Services       Precautions / Restrictions Precautions Precautions: None Restrictions Weight Bearing Restrictions: Yes LLE Weight Bearing: Weight bearing as tolerated      Mobility  Bed Mobility Overal bed mobility: Modified Independent                Transfers Overall transfer level: Needs assistance Equipment used: Rolling walker (2 wheeled) Transfers: Sit to/from Stand Sit to Stand: Supervision         General transfer comment: Cues for UE use only.  pt able to perform without A.    Ambulation/Gait Ambulation/Gait assistance: Supervision Ambulation Distance (Feet): 450 Feet Assistive device: Rolling walker (2 wheeled) Gait Pattern/deviations: Step-through pattern;Decreased stride length     General Gait Details: pt doing very well with step-through gait.  No deficits or LOB noted.    Stairs Stairs: Yes Stairs assistance: Supervision Stair Management: One rail Right;Alternating pattern;Step to pattern;Forwards Number of Stairs: 5 General stair comments: pt able to negotiate steps with step-through pattern ascending and step-to descending.  No difficulties.    Wheelchair Mobility    Modified Rankin (Stroke Patients Only)       Balance Overall balance assessment: Needs assistance Sitting-balance support: No upper extremity supported;Feet supported Sitting balance-Leahy Scale: Good      Standing balance support: Single extremity supported;Bilateral upper extremity supported;No upper extremity supported;During functional activity Standing balance-Leahy Scale: Fair                               Pertinent Vitals/Pain Pain Assessment: Faces Faces Pain Scale: Hurts a little bit Pain Location: L knee. Pain Descriptors / Indicators: Tightness Pain Intervention(s): Monitored during session;Premedicated before session;Repositioned    Home Living Family/patient expects to be discharged to:: Private residence Living Arrangements: Spouse/significant other Available Help at Discharge: Family;Available PRN/intermittently Type of Home: House Home Access: Stairs to enter Entrance Stairs-Rails: Right Entrance Stairs-Number of Steps: 3 Home Layout: One level Home Equipment: Walker - 2 wheels;Cane - single point;Bedside commode;Tub bench      Prior Function Level of Independence: Independent               Hand Dominance        Extremity/Trunk Assessment   Upper Extremity Assessment: Defer to OT evaluation           Lower Extremity Assessment: RLE deficits/detail RLE Deficits / Details: Sensation intact, Strength WFL, AROM ~5 - 60    Cervical / Trunk Assessment: Normal  Communication   Communication: No difficulties  Cognition Arousal/Alertness: Awake/alert Behavior During Therapy: WFL for tasks assessed/performed Overall Cognitive Status: Within Functional Limits for tasks assessed                      General Comments      Exercises Total Joint Exercises  Quad Sets: AROM;Left;10 reps Hip ABduction/ADduction: AROM;Left;10 reps Straight Leg Raises: AROM;Left;10 reps Long Arc Quad: AROM;Left;10 reps Knee Flexion: AROM;Left;10 reps      Assessment/Plan    PT Assessment Patient needs continued PT services  PT Diagnosis Abnormality of gait   PT Problem List Decreased strength;Decreased range of motion;Decreased activity  tolerance;Decreased balance;Decreased mobility;Decreased knowledge of use of DME  PT Treatment Interventions DME instruction;Gait training;Stair training;Functional mobility training;Therapeutic activities;Therapeutic exercise;Balance training;Patient/family education   PT Goals (Current goals can be found in the Care Plan section) Acute Rehab PT Goals Patient Stated Goal: Home today. PT Goal Formulation: With patient Time For Goal Achievement: 10/20/15 Potential to Achieve Goals: Good    Frequency 7X/week   Barriers to discharge        Co-evaluation               End of Session Equipment Utilized During Treatment: Gait belt Activity Tolerance: Patient tolerated treatment well Patient left: in chair;with call bell/phone within reach Nurse Communication: Mobility status         Time: [id number] PT Time Calculation (min) (ACUTE ONLY): 27 min   Charges:   PT Evaluation $PT Eval Low Complexity: 1 Procedure PT Treatments $Gait Training: 8-22 mins   PT G CodesCatarina Hartshorn, [place] 10/13/2015, 9:54 AM

## 2016-06-04 ENCOUNTER — Ambulatory Visit (INDEPENDENT_AMBULATORY_CARE_PROVIDER_SITE_OTHER): Payer: Medicare Other | Admitting: Podiatry

## 2016-06-04 ENCOUNTER — Ambulatory Visit (INDEPENDENT_AMBULATORY_CARE_PROVIDER_SITE_OTHER): Payer: Medicare Other

## 2016-06-04 ENCOUNTER — Encounter: Payer: Self-pay | Admitting: Podiatry

## 2016-06-04 VITALS — BP 128/73 | HR 67 | Ht 62.0 in | Wt 151.0 lb

## 2016-06-04 DIAGNOSIS — M722 Plantar fascial fibromatosis: Secondary | ICD-10-CM

## 2016-06-04 DIAGNOSIS — M79675 Pain in left toe(s): Secondary | ICD-10-CM | POA: Diagnosis not present

## 2016-06-04 MED ORDER — TRIAMCINOLONE ACETONIDE 10 MG/ML IJ SUSP
10.0000 mg | Freq: Once | INTRAMUSCULAR | Status: AC
  Administered 2016-06-04: 10 mg

## 2016-06-04 NOTE — Progress Notes (Signed)
Subjective:     Patient ID: Lauren Montgomery, female   DOB: [birth year], 71 y.o.   MRN: [id number]  HPI patient presents with a lot of pain in the plantar heel right and also malposition of the big toe left over right with history of bunion surgery   Review of Systems  All other systems reviewed and are negative.      Objective:   Physical Exam  Constitutional: She is oriented to person, place, and time.  Cardiovascular: Intact distal pulses.   Musculoskeletal: Normal range of motion.  Neurological: She is oriented to person, place, and time.  Skin: Skin is warm.  Nursing note and vitals reviewed.  neurovascular status intact muscle strength adequate range of motion within normal limits with patient found to have exquisite discomfort plantar aspect right heel and deviation of the hallux left over right with moderate structural bunion deformity and hammertoe deformity second left over right     Assessment:     Acute plan her fasciitis right with structural deformity left over right foot    Plan:     H&P conditions reviewed and at this time I injected the plantar fascia right 3 mg Kenalog 5 mg Xylocaine and applied fascial brace and instructed on physical therapy. For the bunions and do not recommend correction and less they were to get worse  X-ray report indicated significant structural bunion deformity left with deviation the hallux against second toe left over right

## 2016-06-20 ENCOUNTER — Ambulatory Visit (INDEPENDENT_AMBULATORY_CARE_PROVIDER_SITE_OTHER): Payer: Medicare Other | Admitting: Podiatry

## 2016-06-20 ENCOUNTER — Encounter: Payer: Self-pay | Admitting: Podiatry

## 2016-06-20 DIAGNOSIS — M722 Plantar fascial fibromatosis: Secondary | ICD-10-CM

## 2016-06-20 MED ORDER — TRIAMCINOLONE ACETONIDE 10 MG/ML IJ SUSP
10.0000 mg | Freq: Once | INTRAMUSCULAR | Status: AC
  Administered 2016-06-20: 10 mg

## 2016-06-20 NOTE — Progress Notes (Signed)
Subjective:    Patient ID: Lauren Montgomery, female   DOB: 71 y.o.   MRN: [id number]   HPI patient states that she is doing well with pain in her right heel still present but overall significantly improved    ROS      Objective:  Physical Exam Neurovascular status unchanged with discomfort still plantar heel right at the insertional point tendon calcaneus with inflammation fluid buildup noted    Assessment:     Planter fasciitis right over left heel still present but improved    Plan:    Reviewed condition and reinjected plantar fascia right 3 mg Kenalog 5 mg Xylocaine advised on continued utilization of brace supportive shoe gears and may require orthotics one time in future

## 2018-12-01 ENCOUNTER — Other Ambulatory Visit: Payer: Self-pay | Admitting: Physician Assistant

## 2018-12-01 DIAGNOSIS — I6521 Occlusion and stenosis of right carotid artery: Secondary | ICD-10-CM

## 2018-12-08 ENCOUNTER — Ambulatory Visit
Admission: RE | Admit: 2018-12-08 | Discharge: 2018-12-08 | Disposition: A | Discharge status: Home / Self Care | Payer: Medicare Other | Source: Ambulatory Visit | Attending: Physician Assistant | Admitting: Physician Assistant

## 2018-12-08 DIAGNOSIS — I6521 Occlusion and stenosis of right carotid artery: Secondary | ICD-10-CM

## 2020-02-02 IMAGING — US US CAROTID DUPLEX BILAT
1 series · 13 of 24 positions shown · non-contrast
Comparison: None.

CLINICAL DATA: 73-year-old female with a history of right carotid
calcifications

EXAM:
BILATERAL CAROTID DUPLEX ULTRASOUND
TECHNIQUE: Gray scale imaging, color Doppler and duplex ultrasound were
performed of bilateral carotid and vertebral arteries in the neck.

[Series 1: us carotid duplex bilat · 0.05mm/px · 13 of 47 slices shown]
[im 1/47]
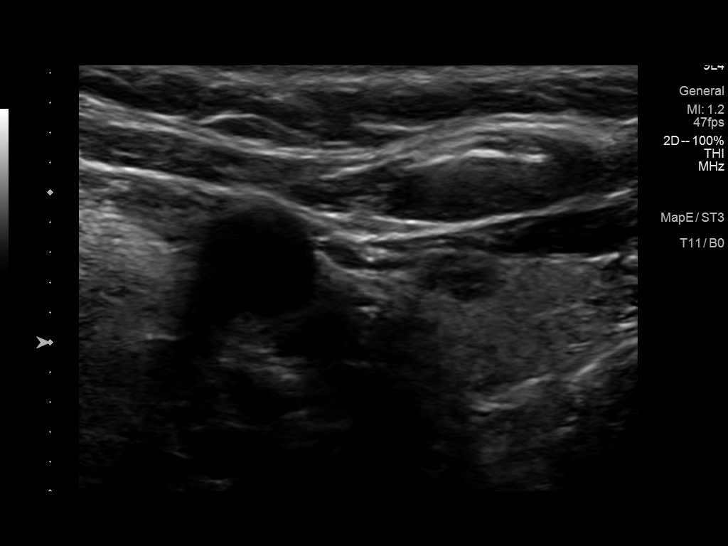
[im 5/47]
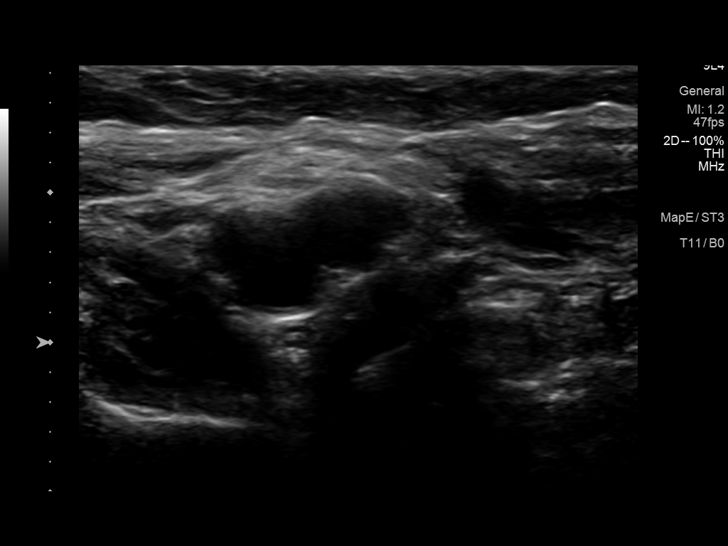
[im 9/47]
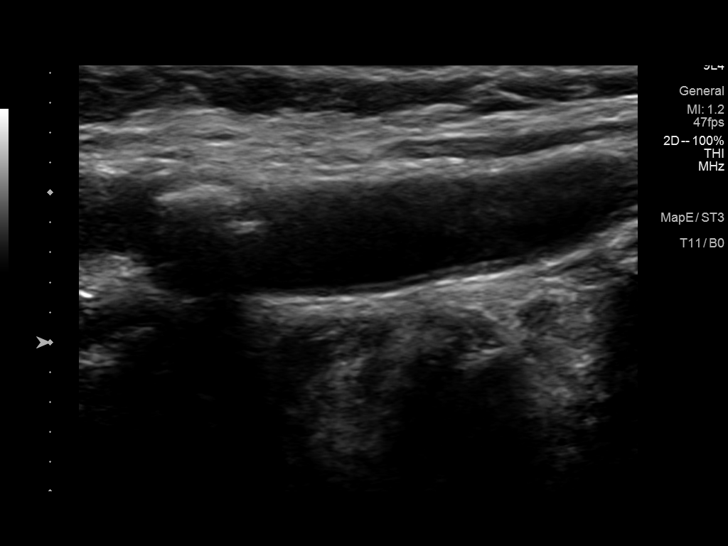
[im 13/47]
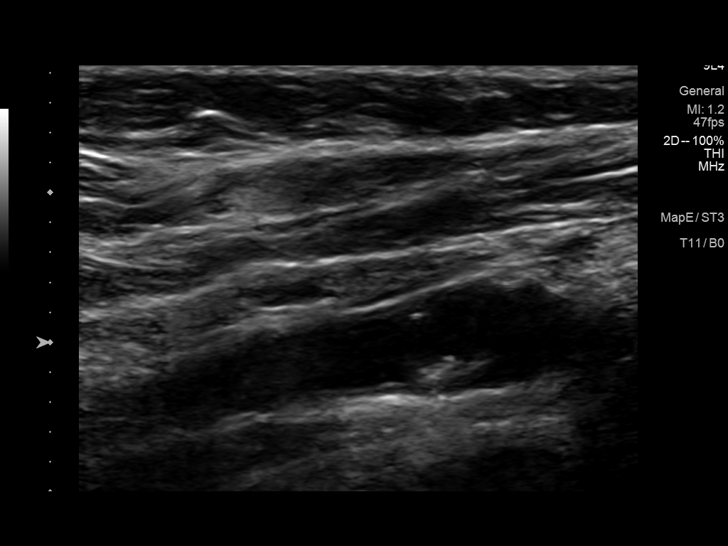
[im 17/47]
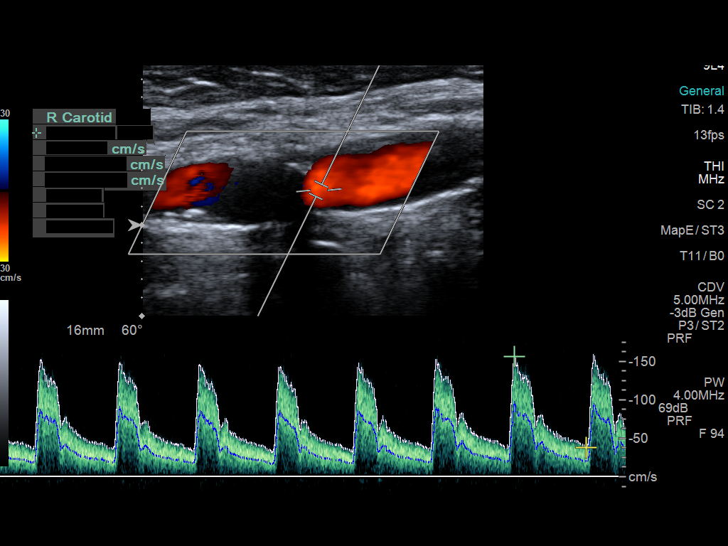
[im 21/47]
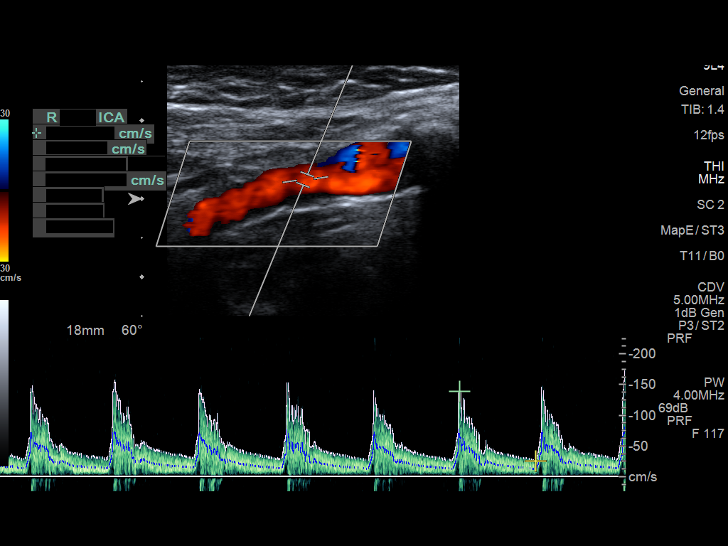
[im 25/47]
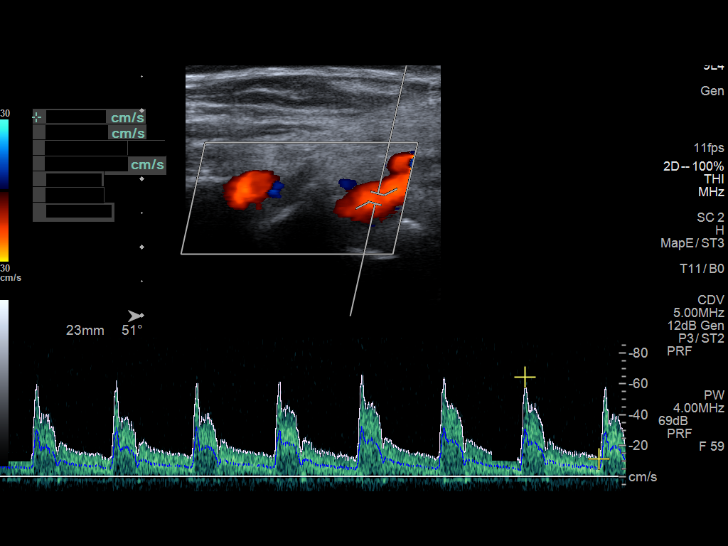
[im 27/47]
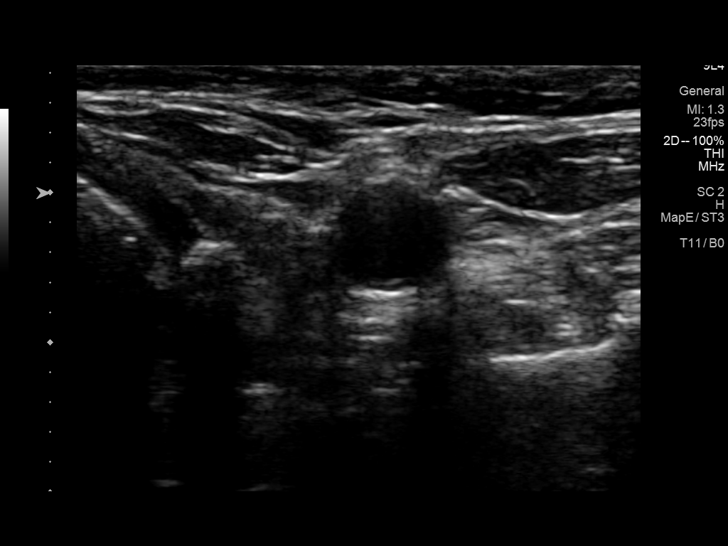
[im 31/47]
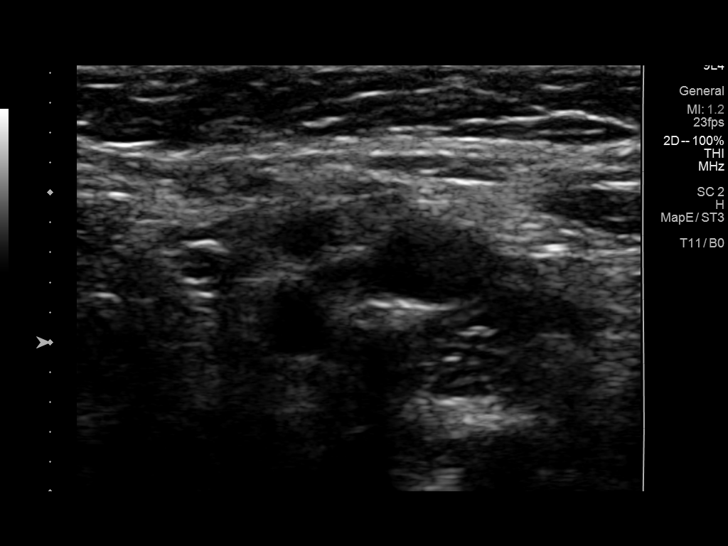
[im 35/47]
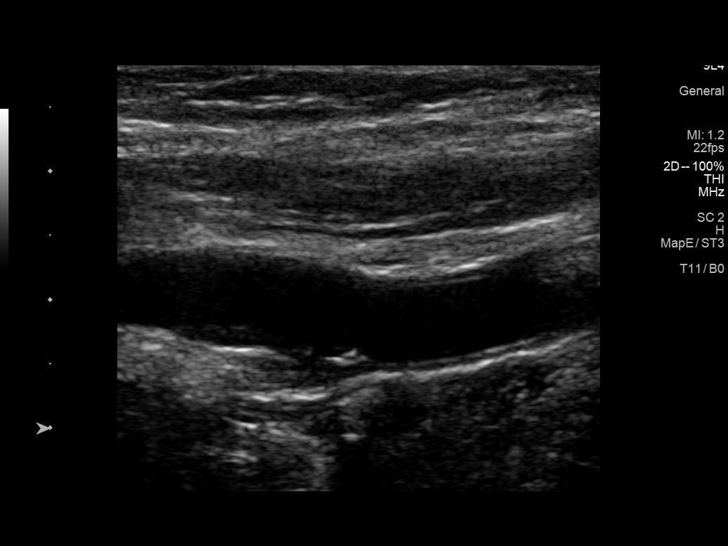
[im 39/47]
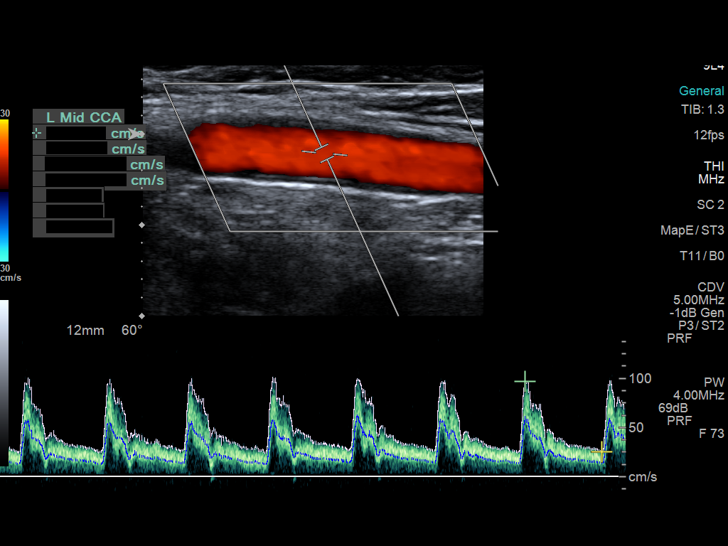
[im 43/47]
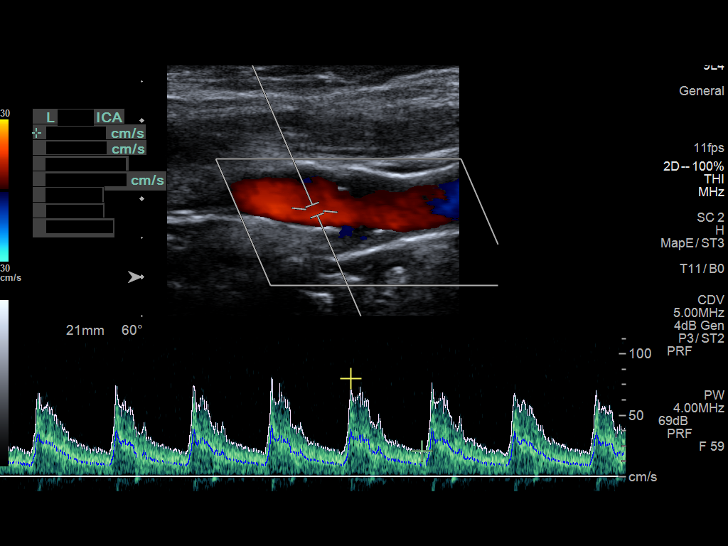
[im 47/47]
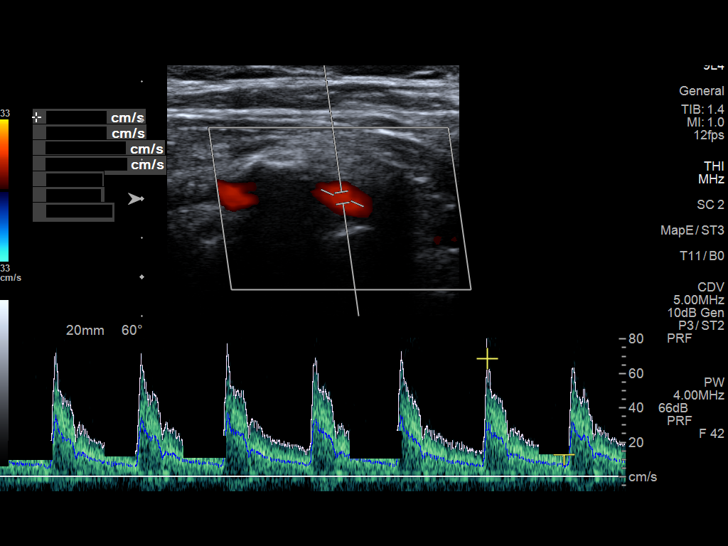

[13 of 24 positions shown; findings below may reference images not displayed]

FINDINGS: Criteria: Quantification of carotid stenosis is based on velocity
parameters that correlate the residual internal carotid diameter
with NASCET-based stenosis levels, using the diameter of the distal
internal carotid lumen as the denominator for stenosis measurement.

The following velocity measurements were obtained:

RIGHT

ICA:  Systolic 139 cm/sec, Diastolic 26 cm/sec

CCA:  103 cm/sec

SYSTOLIC ICA/CCA RATIO:

ECA:  182 cm/sec

LEFT

ICA:  Systolic 80 cm/sec, Diastolic 21 cm/sec

CCA:  111 cm/sec

SYSTOLIC ICA/CCA RATIO:

ECA:  111 cm/sec

Right Brachial SBP: 150

Left Brachial SBP: 143

RIGHT CAROTID ARTERY: No significant calcifications of the right
common carotid artery. Intermediate waveform maintained.
Heterogeneous and partially calcified plaque at the right carotid
bifurcation. No significant lumen shadowing. Low resistance waveform
of the right ICA. No significant tortuosity.

RIGHT VERTEBRAL ARTERY: Antegrade flow with low resistance waveform.

LEFT CAROTID ARTERY: No significant calcifications of the left
common carotid artery. Intermediate waveform maintained.
Heterogeneous and partially calcified plaque at the left carotid
bifurcation without significant lumen shadowing. Low resistance
waveform of the left ICA. No significant tortuosity.

LEFT VERTEBRAL ARTERY:  Antegrade flow with low resistance waveform.
IMPRESSION: Color duplex indicates minimal heterogeneous and calcified plaque,
with no hemodynamically significant stenosis by duplex criteria in
the extracranial cerebrovascular circulation.

## 2021-12-29 ENCOUNTER — Other Ambulatory Visit: Payer: Self-pay | Admitting: Orthopedic Surgery

## 2022-01-09 NOTE — Pre-Procedure Instructions (Signed)
Surgical Instructions    Your procedure is scheduled on Thursday, November 30.  Report to [facility] Entrance "A" at 1:20PM., then check in with the Admitting office.  Call this number if you have problems the morning of surgery:  [phone]   If you have any questions prior to your surgery date call [phone]: Open Monday-Friday 8am-4pm If you experience any cold or flu symptoms such as cough, fever, chills, shortness of breath, etc. between now and your scheduled surgery, please notify us at the above number     Remember:  Do not eat after midnight the night before your surgery  You may drink clear liquids until 12:20PM the day of your surgery.   Clear liquids allowed are: Water, Non-Citrus Juices (without pulp), Carbonated Beverages, Clear Tea, Black Coffee ONLY (NO MILK, CREAM OR POWDERED CREAMER of any kind), and Gatorade    Take these medicines the morning of surgery with A SIP OF WATER:  gabapentin (NEURONTIN)  rosuvastatin (CRESTOR)  acetaminophen (TYLENOL) if needed   As of today, STOP taking any Aspirin (unless otherwise instructed by your surgeon) Aleve, Naproxen, Ibuprofen, Motrin, Advil, Goody's, BC's, all herbal medications, fish oil, and all vitamins.   [facility] is not responsible for any belongings or valuables.    Do NOT Smoke (Tobacco/Vaping)  24 hours prior to your procedure  If you use a CPAP at night, you may bring your mask for your overnight stay.   Contacts, glasses, hearing aids, dentures or partials may not be worn into surgery, please bring cases for these belongings   For patients admitted to the hospital, discharge time will be determined by your treatment team.   Patients discharged the day of surgery will not be allowed to drive home, and someone needs to stay with them for 24 hours.   SURGICAL WAITING ROOM VISITATION Patients having surgery or a procedure may have no more than 2 support people in the waiting area - these visitors  may rotate.   Children under the age of 75 must have an adult with them who is not the patient. If the patient needs to stay at the hospital during part of their recovery, the visitor guidelines for inpatient rooms apply. Pre-op nurse will coordinate an appropriate time for 1 support person to accompany patient in pre-op.  This support person may not rotate.   Please refer to [email or web address] for the visitor guidelines for Inpatients (after your surgery is over and you are in a regular room).    Special instructions:    Oral Hygiene is also important to reduce your risk of infection.  Remember - BRUSH YOUR TEETH THE MORNING OF SURGERY WITH YOUR REGULAR TOOTHPASTE   - Preparing For Surgery  Before surgery, you can play an important role. Because skin is not sterile, your skin needs to be as free of germs as possible. You can reduce the number of germs on your skin by washing with CHG (chlorahexidine gluconate) Soap before surgery.  CHG is an antiseptic cleaner which kills germs and bonds with the skin to continue killing germs even after washing.     Please do not use if you have an allergy to CHG or antibacterial soaps. If your skin becomes reddened/irritated stop using the CHG.  Do not shave (including legs and underarms) for at least 48 hours prior to first CHG shower. It is OK to shave your face.  Please follow these instructions carefully.     Shower the [employer] BEFORE SURGERY  and the MORNING OF SURGERY with CHG Soap.   If you chose to wash your hair, wash your hair first as usual with your normal shampoo. After you shampoo, rinse your hair and body thoroughly to remove the shampoo.  Then [employer] and genitals (private parts) with your normal soap and rinse thoroughly to remove soap.  After that Use CHG Soap as you would any other liquid soap. You can apply CHG directly to the skin and wash gently with a scrungie or a clean  washcloth.   Apply the CHG Soap to your body ONLY FROM THE NECK DOWN.  Do not use on open wounds or open sores. Avoid contact with your eyes, ears, mouth and genitals (private parts). Wash Face and genitals (private parts)  with your normal soap.   Wash thoroughly, paying special attention to the area where your surgery will be performed.  Thoroughly rinse your body with warm water from the neck down.  DO NOT shower/wash with your normal soap after using and rinsing off the CHG Soap.  Pat yourself dry with a CLEAN TOWEL.  Wear CLEAN PAJAMAS to bed the night before surgery  Place CLEAN SHEETS on your bed the night before your surgery  DO NOT SLEEP WITH PETS.   Day of Surgery:  Take a shower with CHG soap. Wear Clean/Comfortable clothing the morning of surgery Do not wear jewelry or makeup. Do not wear lotions, powders, perfumes/cologne or deodorant. Do not shave 48 hours prior to surgery.  Men may shave face and neck. Do not bring valuables to the hospital. Do not wear nail polish, gel polish, artificial nails, or any other type of covering on natural nails (fingers and toes) If you have artificial nails or gel coating that need to be removed by a nail salon, please have this removed prior to surgery. Artificial nails or gel coating may interfere with anesthesia's ability to adequately monitor your vital signs. Remember to brush your teeth WITH YOUR REGULAR TOOTHPASTE.    If you received a COVID test during your pre-op visit, it is requested that you wear a mask when out in public, stay away from anyone that may not be feeling well, and notify your surgeon if you develop symptoms. If you have been in contact with anyone that has tested positive in the last 10 days, please notify your surgeon.    Please read over the following fact sheets that you were given.

## 2022-01-10 ENCOUNTER — Other Ambulatory Visit: Payer: Self-pay

## 2022-01-10 ENCOUNTER — Encounter (HOSPITAL_COMMUNITY): Payer: Self-pay

## 2022-01-10 ENCOUNTER — Encounter (HOSPITAL_COMMUNITY)
Admission: RE | Admit: 2022-01-10 | Discharge: 2022-01-10 | Disposition: A | Discharge status: Home / Self Care | Payer: Medicare Other | Source: Ambulatory Visit | Attending: Orthopedic Surgery | Admitting: Orthopedic Surgery

## 2022-01-10 VITALS — BP 144/79 | HR 74 | Temp 97.8°F | Resp 18 | Ht 63.0 in | Wt 160.0 lb

## 2022-01-10 DIAGNOSIS — Z01812 Encounter for preprocedural laboratory examination: Secondary | ICD-10-CM | POA: Diagnosis present

## 2022-01-10 DIAGNOSIS — Z01818 Encounter for other preprocedural examination: Secondary | ICD-10-CM

## 2022-01-10 LAB — TYPE AND SCREEN
ABO/RH(D): B NEG
Antibody Screen: NEGATIVE

## 2022-01-10 LAB — CBC
HCT: 41.2 % (ref 36.0–46.0)
Hemoglobin: 13.6 g/dL (ref 12.0–15.0)
MCH: 31.4 pg (ref 26.0–34.0)
MCHC: 33 g/dL (ref 30.0–36.0)
MCV: 95.2 fL (ref 80.0–100.0)
Platelets: 205 10*3/uL (ref 150–400)
RBC: 4.33 MIL/uL (ref 3.87–5.11)
RDW: 13.5 % (ref 11.5–15.5)
WBC: 7.9 10*3/uL (ref 4.0–10.5)
nRBC: 0 % (ref 0.0–0.2)

## 2022-01-10 LAB — SURGICAL PCR SCREEN
MRSA, PCR: NEGATIVE
Staphylococcus aureus: NEGATIVE

## 2022-01-10 NOTE — Progress Notes (Signed)
PCP - Clyde Lundborg, PA-C Cardiologist - pt denies  PPM/ICD - pt denies Device Orders - n/a Rep Notified - n/a  Chest x-ray - n/a EKG - 09/29/2015 Stress Test - pt denies ECHO - pt denies Cardiac Cath - pt denies  Sleep Study - pt denies  CPAP - n/a  Patient denies Diabetes   Blood Thinner Instructions:pt denies Aspirin Instructions:pt denies  ERAS Protcol -yes PRE-SURGERY Ensure-given in PAT. Pt instructed to drink this before 12:20 pm day of surgery.   COVID TEST- n/a   Anesthesia review: NO  Patient denies shortness of breath, fever, cough and chest pain at PAT appointment   All instructions explained to the patient, with a verbal understanding of the material. Patient agrees to go over the instructions while at home for a better understanding. Patient also instructed to self quarantine after being tested for COVID-19. The opportunity to ask questions was provided.

## 2022-01-25 ENCOUNTER — Observation Stay (HOSPITAL_COMMUNITY)
Admission: RE | Admit: 2022-01-25 | Discharge: 2022-01-26 | Disposition: A | Discharge status: Home Health | Payer: Medicare Other | Attending: Orthopedic Surgery | Admitting: Orthopedic Surgery

## 2022-01-25 ENCOUNTER — Encounter (HOSPITAL_COMMUNITY)
Admission: RE | Disposition: A | Discharge status: Home Health | Payer: Self-pay | Source: Home / Self Care | Attending: Orthopedic Surgery

## 2022-01-25 ENCOUNTER — Encounter (HOSPITAL_COMMUNITY): Payer: Self-pay | Admitting: Orthopedic Surgery

## 2022-01-25 ENCOUNTER — Ambulatory Visit (HOSPITAL_BASED_OUTPATIENT_CLINIC_OR_DEPARTMENT_OTHER): Payer: Medicare Other | Admitting: Certified Registered Nurse Anesthetist

## 2022-01-25 ENCOUNTER — Ambulatory Visit (HOSPITAL_COMMUNITY): Payer: Medicare Other | Admitting: Certified Registered Nurse Anesthetist

## 2022-01-25 ENCOUNTER — Other Ambulatory Visit: Payer: Self-pay

## 2022-01-25 ENCOUNTER — Ambulatory Visit (HOSPITAL_COMMUNITY): Payer: Medicare Other

## 2022-01-25 DIAGNOSIS — Z85828 Personal history of other malignant neoplasm of skin: Secondary | ICD-10-CM | POA: Insufficient documentation

## 2022-01-25 DIAGNOSIS — M48061 Spinal stenosis, lumbar region without neurogenic claudication: Principal | ICD-10-CM | POA: Insufficient documentation

## 2022-01-25 DIAGNOSIS — Z96643 Presence of artificial hip joint, bilateral: Secondary | ICD-10-CM | POA: Insufficient documentation

## 2022-01-25 DIAGNOSIS — M4316 Spondylolisthesis, lumbar region: Secondary | ICD-10-CM | POA: Diagnosis not present

## 2022-01-25 DIAGNOSIS — M5416 Radiculopathy, lumbar region: Secondary | ICD-10-CM | POA: Diagnosis not present

## 2022-01-25 DIAGNOSIS — Z96653 Presence of artificial knee joint, bilateral: Secondary | ICD-10-CM | POA: Insufficient documentation

## 2022-01-25 HISTORY — PX: TRANSFORAMINAL LUMBAR INTERBODY FUSION (TLIF) WITH PEDICLE SCREW FIXATION 1 LEVEL: SHX6141

## 2022-01-25 LAB — POCT I-STAT EG7
Acid-base deficit: 5 mmol/L — ABNORMAL HIGH (ref 0.0–2.0)
Bicarbonate: 19 mmol/L — ABNORMAL LOW (ref 20.0–28.0)
Calcium, Ion: 1.09 mmol/L — ABNORMAL LOW (ref 1.15–1.40)
HCT: 26 % — ABNORMAL LOW (ref 36.0–46.0)
Hemoglobin: 8.8 g/dL — ABNORMAL LOW (ref 12.0–15.0)
O2 Saturation: 70 %
Patient temperature: 35.9
Potassium: 5.3 mmol/L — ABNORMAL HIGH (ref 3.5–5.1)
Sodium: 136 mmol/L (ref 135–145)
TCO2: 20 mmol/L — ABNORMAL LOW (ref 22–32)
pCO2, Ven: 30.8 mmHg — ABNORMAL LOW (ref 44–60)
pH, Ven: 7.394 (ref 7.25–7.43)
pO2, Ven: 34 mmHg (ref 32–45)

## 2022-01-25 SURGERY — TRANSFORAMINAL LUMBAR INTERBODY FUSION (TLIF) WITH PEDICLE SCREW FIXATION 1 LEVEL
Anesthesia: General | Laterality: Left

## 2022-01-25 MED ORDER — HYDROMORPHONE HCL 1 MG/ML IJ SOLN
0.2500 mg | INTRAMUSCULAR | Status: DC | PRN
  Administered 2022-01-25: 0.5 mg via INTRAVENOUS

## 2022-01-25 MED ORDER — POTASSIUM CHLORIDE IN NACL 20-0.9 MEQ/L-% IV SOLN: INTRAVENOUS | Status: DC

## 2022-01-25 MED ORDER — OXYCODONE HCL 5 MG PO TABS: 5.0000 mg | ORAL_TABLET | Freq: Once | ORAL | Status: DC | PRN

## 2022-01-25 MED ORDER — THROMBIN 20000 UNITS EX SOLR
CUTANEOUS | Status: DC | PRN
  Administered 2022-01-25: 20 mL

## 2022-01-25 MED ORDER — FENTANYL CITRATE (PF) 250 MCG/5ML IJ SOLN
INTRAMUSCULAR | Status: AC
  Filled 2022-01-25: qty 5

## 2022-01-25 MED ORDER — ALUM & MAG HYDROXIDE-SIMETH 200-200-20 MG/5ML PO SUSP: 30.0000 mL | Freq: Four times a day (QID) | ORAL | Status: DC | PRN

## 2022-01-25 MED ORDER — ROCURONIUM BROMIDE 100 MG/10ML IV SOLN
INTRAVENOUS | Status: DC | PRN
  Administered 2022-01-25: 30 mg via INTRAVENOUS
  Administered 2022-01-25: 70 mg via INTRAVENOUS
  Administered 2022-01-25: 30 mg via INTRAVENOUS

## 2022-01-25 MED ORDER — THROMBIN 20000 UNITS EX SOLR
CUTANEOUS | Status: AC
  Filled 2022-01-25: qty 20000

## 2022-01-25 MED ORDER — FLEET ENEMA 7-19 GM/118ML RE ENEM: 1.0000 | ENEMA | Freq: Once | RECTAL | Status: DC | PRN

## 2022-01-25 MED ORDER — SENNOSIDES-DOCUSATE SODIUM 8.6-50 MG PO TABS: 1.0000 | ORAL_TABLET | Freq: Every evening | ORAL | Status: DC | PRN

## 2022-01-25 MED ORDER — FENTANYL CITRATE (PF) 250 MCG/5ML IJ SOLN
INTRAMUSCULAR | Status: DC | PRN
  Administered 2022-01-25: 50 ug via INTRAVENOUS
  Administered 2022-01-25: 25 ug via INTRAVENOUS
  Administered 2022-01-25 (×2): 50 ug via INTRAVENOUS
  Administered 2022-01-25: 25 ug via INTRAVENOUS
  Administered 2022-01-25: 50 ug via INTRAVENOUS

## 2022-01-25 MED ORDER — ALBUMIN HUMAN 5 % IV SOLN: INTRAVENOUS | Status: DC | PRN

## 2022-01-25 MED ORDER — SODIUM CHLORIDE 0.9% FLUSH: 3.0000 mL | INTRAVENOUS | Status: DC | PRN

## 2022-01-25 MED ORDER — ADULT MULTIVITAMIN W/MINERALS CH
1.0000 | ORAL_TABLET | Freq: Every day | ORAL | Status: DC
  Administered 2022-01-25: 1 via ORAL
  Filled 2022-01-25: qty 1

## 2022-01-25 MED ORDER — ROCURONIUM BROMIDE 10 MG/ML (PF) SYRINGE
PREFILLED_SYRINGE | INTRAVENOUS | Status: AC
  Filled 2022-01-25: qty 10

## 2022-01-25 MED ORDER — 0.9 % SODIUM CHLORIDE (POUR BTL) OPTIME
TOPICAL | Status: DC | PRN
  Administered 2022-01-25: 1000 mL
  Administered 2022-01-25: 500 mL

## 2022-01-25 MED ORDER — SODIUM CHLORIDE 0.9% FLUSH
3.0000 mL | Freq: Two times a day (BID) | INTRAVENOUS | Status: DC
  Administered 2022-01-25: 3 mL via INTRAVENOUS

## 2022-01-25 MED ORDER — AMISULPRIDE (ANTIEMETIC) 5 MG/2ML IV SOLN
10.0000 mg | Freq: Once | INTRAVENOUS | Status: AC
  Administered 2022-01-25: 10 mg via INTRAVENOUS

## 2022-01-25 MED ORDER — ONDANSETRON HCL 4 MG/2ML IJ SOLN
4.0000 mg | Freq: Four times a day (QID) | INTRAMUSCULAR | Status: DC | PRN
  Administered 2022-01-25: 4 mg via INTRAVENOUS
  Filled 2022-01-25: qty 2

## 2022-01-25 MED ORDER — METHOCARBAMOL 500 MG PO TABS: 500.0000 mg | ORAL_TABLET | Freq: Four times a day (QID) | ORAL | Status: DC | PRN

## 2022-01-25 MED ORDER — DEXMEDETOMIDINE HCL IN NACL 80 MCG/20ML IV SOLN
INTRAVENOUS | Status: AC
  Filled 2022-01-25: qty 20

## 2022-01-25 MED ORDER — HEMOSTATIC AGENTS (NO CHARGE) OPTIME
TOPICAL | Status: DC | PRN
  Administered 2022-01-25: 1

## 2022-01-25 MED ORDER — ACETAMINOPHEN 650 MG RE SUPP: 650.0000 mg | RECTAL | Status: DC | PRN

## 2022-01-25 MED ORDER — AMISULPRIDE (ANTIEMETIC) 5 MG/2ML IV SOLN
INTRAVENOUS | Status: AC
  Filled 2022-01-25: qty 4

## 2022-01-25 MED ORDER — PHENOL 1.4 % MT LIQD
1.0000 | OROMUCOSAL | Status: DC | PRN
  Administered 2022-01-25: 1 via OROMUCOSAL
  Filled 2022-01-25: qty 177

## 2022-01-25 MED ORDER — BUPIVACAINE-EPINEPHRINE (PF) 0.25% -1:200000 IJ SOLN
INTRAMUSCULAR | Status: AC
  Filled 2022-01-25: qty 30

## 2022-01-25 MED ORDER — HYDROCODONE-ACETAMINOPHEN 5-325 MG PO TABS
1.0000 | ORAL_TABLET | ORAL | Status: DC | PRN
  Administered 2022-01-26: 1 via ORAL
  Filled 2022-01-25: qty 1

## 2022-01-25 MED ORDER — PHENYLEPHRINE HCL-NACL 20-0.9 MG/250ML-% IV SOLN
INTRAVENOUS | Status: DC | PRN
  Administered 2022-01-25: 20 ug/min via INTRAVENOUS

## 2022-01-25 MED ORDER — ONDANSETRON HCL 4 MG/2ML IJ SOLN
INTRAMUSCULAR | Status: AC
  Filled 2022-01-25: qty 2

## 2022-01-25 MED ORDER — ZOLPIDEM TARTRATE 5 MG PO TABS: 5.0000 mg | ORAL_TABLET | Freq: Every evening | ORAL | Status: DC | PRN

## 2022-01-25 MED ORDER — SUGAMMADEX SODIUM 200 MG/2ML IV SOLN
INTRAVENOUS | Status: DC | PRN
  Administered 2022-01-25: 145.2 mg via INTRAVENOUS

## 2022-01-25 MED ORDER — BISACODYL 5 MG PO TBEC: 5.0000 mg | DELAYED_RELEASE_TABLET | Freq: Every day | ORAL | Status: DC | PRN

## 2022-01-25 MED ORDER — PROPOFOL 10 MG/ML IV BOLUS
INTRAVENOUS | Status: DC | PRN
  Administered 2022-01-25: 130 mg via INTRAVENOUS

## 2022-01-25 MED ORDER — GABAPENTIN 300 MG PO CAPS: 300.0000 mg | ORAL_CAPSULE | ORAL | Status: DC

## 2022-01-25 MED ORDER — PROPOFOL 10 MG/ML IV BOLUS
INTRAVENOUS | Status: AC
  Filled 2022-01-25: qty 20

## 2022-01-25 MED ORDER — OXYCODONE HCL 5 MG/5ML PO SOLN: 5.0000 mg | Freq: Once | ORAL | Status: DC | PRN

## 2022-01-25 MED ORDER — DOCUSATE SODIUM 100 MG PO CAPS
100.0000 mg | ORAL_CAPSULE | Freq: Two times a day (BID) | ORAL | Status: DC
  Administered 2022-01-25: 100 mg via ORAL
  Filled 2022-01-25: qty 1

## 2022-01-25 MED ORDER — POLYVINYL ALCOHOL 1.4 % OP SOLN: 1.0000 [drp] | Freq: Every day | OPHTHALMIC | Status: DC | PRN

## 2022-01-25 MED ORDER — HYDROMORPHONE HCL 1 MG/ML IJ SOLN
INTRAMUSCULAR | Status: AC
  Filled 2022-01-25: qty 1

## 2022-01-25 MED ORDER — ONDANSETRON HCL 4 MG PO TABS: 4.0000 mg | ORAL_TABLET | Freq: Four times a day (QID) | ORAL | Status: DC | PRN

## 2022-01-25 MED ORDER — LIDOCAINE 2% (20 MG/ML) 5 ML SYRINGE
INTRAMUSCULAR | Status: AC
  Filled 2022-01-25: qty 5

## 2022-01-25 MED ORDER — PHENYLEPHRINE 80 MCG/ML (10ML) SYRINGE FOR IV PUSH (FOR BLOOD PRESSURE SUPPORT)
PREFILLED_SYRINGE | INTRAVENOUS | Status: DC | PRN
  Administered 2022-01-25 (×2): 80 ug via INTRAVENOUS

## 2022-01-25 MED ORDER — ACETAMINOPHEN 10 MG/ML IV SOLN
INTRAVENOUS | Status: DC | PRN
  Administered 2022-01-25: 1000 mg via INTRAVENOUS

## 2022-01-25 MED ORDER — ORAL CARE MOUTH RINSE: 15.0000 mL | Freq: Once | OROMUCOSAL | Status: AC

## 2022-01-25 MED ORDER — POVIDONE-IODINE 7.5 % EX SOLN
Freq: Once | CUTANEOUS | Status: DC
  Filled 2022-01-25: qty 118

## 2022-01-25 MED ORDER — BUPIVACAINE-EPINEPHRINE 0.25% -1:200000 IJ SOLN
INTRAMUSCULAR | Status: DC | PRN
  Administered 2022-01-25: 5 mL

## 2022-01-25 MED ORDER — ONDANSETRON HCL 4 MG/2ML IJ SOLN: 4.0000 mg | Freq: Once | INTRAMUSCULAR | Status: DC | PRN

## 2022-01-25 MED ORDER — CEFAZOLIN SODIUM-DEXTROSE 2-4 GM/100ML-% IV SOLN
2.0000 g | INTRAVENOUS | Status: AC
  Administered 2022-01-25: 2 g via INTRAVENOUS
  Filled 2022-01-25: qty 100

## 2022-01-25 MED ORDER — BUPIVACAINE LIPOSOME 1.3 % IJ SUSP
INTRAMUSCULAR | Status: AC
  Filled 2022-01-25: qty 20

## 2022-01-25 MED ORDER — CHLORHEXIDINE GLUCONATE 0.12 % MT SOLN
15.0000 mL | Freq: Once | OROMUCOSAL | Status: AC
  Administered 2022-01-25: 15 mL via OROMUCOSAL
  Filled 2022-01-25: qty 15

## 2022-01-25 MED ORDER — CEFAZOLIN SODIUM-DEXTROSE 2-4 GM/100ML-% IV SOLN
2.0000 g | Freq: Three times a day (TID) | INTRAVENOUS | Status: AC
  Administered 2022-01-25 – 2022-01-26 (×2): 2 g via INTRAVENOUS
  Filled 2022-01-25 (×2): qty 100

## 2022-01-25 MED ORDER — MENTHOL 3 MG MT LOZG: 1.0000 | LOZENGE | OROMUCOSAL | Status: DC | PRN

## 2022-01-25 MED ORDER — MORPHINE SULFATE (PF) 2 MG/ML IV SOLN: 1.0000 mg | INTRAVENOUS | Status: DC | PRN

## 2022-01-25 MED ORDER — SUCCINYLCHOLINE CHLORIDE 200 MG/10ML IV SOSY
PREFILLED_SYRINGE | INTRAVENOUS | Status: AC
  Filled 2022-01-25: qty 10

## 2022-01-25 MED ORDER — OXYCODONE-ACETAMINOPHEN 5-325 MG PO TABS: 1.0000 | ORAL_TABLET | ORAL | Status: DC | PRN

## 2022-01-25 MED ORDER — ROSUVASTATIN CALCIUM 5 MG PO TABS
5.0000 mg | ORAL_TABLET | Freq: Every day | ORAL | Status: DC
  Administered 2022-01-25: 5 mg via ORAL
  Filled 2022-01-25: qty 1

## 2022-01-25 MED ORDER — CALCIUM CARBONATE ANTACID 500 MG PO CHEW: 500.0000 mg | CHEWABLE_TABLET | Freq: Every day | ORAL | Status: DC | PRN

## 2022-01-25 MED ORDER — BUPIVACAINE-EPINEPHRINE 0.25% -1:200000 IJ SOLN
INTRAMUSCULAR | Status: DC | PRN
  Administered 2022-01-25: 40 mL

## 2022-01-25 MED ORDER — POLYETHYL GLYCOL-PROPYL GLYCOL 0.4-0.3 % OP GEL: Freq: Every day | OPHTHALMIC | Status: DC | PRN

## 2022-01-25 MED ORDER — LIDOCAINE 2% (20 MG/ML) 5 ML SYRINGE
INTRAMUSCULAR | Status: DC | PRN
  Administered 2022-01-25: 100 mg via INTRAVENOUS

## 2022-01-25 MED ORDER — LACTATED RINGERS IV SOLN: INTRAVENOUS | Status: DC

## 2022-01-25 MED ORDER — METHOCARBAMOL 1000 MG/10ML IJ SOLN: 500.0000 mg | Freq: Four times a day (QID) | INTRAVENOUS | Status: DC | PRN

## 2022-01-25 MED ORDER — DEXMEDETOMIDINE HCL IN NACL 80 MCG/20ML IV SOLN
INTRAVENOUS | Status: DC | PRN
  Administered 2022-01-25: 4 ug via BUCCAL

## 2022-01-25 MED ORDER — SODIUM CHLORIDE 0.9 % IV SOLN: 250.0000 mL | INTRAVENOUS | Status: DC

## 2022-01-25 MED ORDER — HYDROXYZINE HCL 50 MG/ML IM SOLN
50.0000 mg | Freq: Four times a day (QID) | INTRAMUSCULAR | Status: DC | PRN
  Administered 2022-01-25: 50 mg via INTRAMUSCULAR
  Filled 2022-01-25: qty 1

## 2022-01-25 MED ORDER — ACETAMINOPHEN 325 MG PO TABS: 650.0000 mg | ORAL_TABLET | ORAL | Status: DC | PRN

## 2022-01-25 MED ORDER — CENTRUM SILVER 50+WOMEN PO TABS: ORAL_TABLET | Freq: Every day | ORAL | Status: DC

## 2022-01-25 MED ORDER — ONDANSETRON HCL 4 MG/2ML IJ SOLN
INTRAMUSCULAR | Status: DC | PRN
  Administered 2022-01-25: 4 mg via INTRAVENOUS

## 2022-01-25 SURGICAL SUPPLY — 89 items
AGENT HMST KT MTR STRL THRMB (HEMOSTASIS)
APL SKNCLS STERI-STRIP NONHPOA (GAUZE/BANDAGES/DRESSINGS) ×1
BAG COUNTER SPONGE SURGICOUNT (BAG) ×1 IMPLANT
BAG SPNG CNTER NS LX DISP (BAG) ×1
BENZOIN TINCTURE PRP APPL 2/3 (GAUZE/BANDAGES/DRESSINGS) ×1 IMPLANT
BLADE CLIPPER SURG (BLADE) IMPLANT
BUR PRESCISION 1.7 ELITE (BURR) ×1 IMPLANT
BUR ROUND FLUTED 5 RND (BURR) ×1 IMPLANT
BUR ROUND PRECISION 4.0 (BURR) IMPLANT
BUR SABER RD CUTTING 3.0 (BURR) IMPLANT
CAGE SABLE 10X26 6-12 8D (Cage) IMPLANT
CANNULA GRAFT BNE VG PRE-FILL (Bone Implant) IMPLANT
CNTNR URN SCR LID CUP LEK RST (MISCELLANEOUS) ×1 IMPLANT
CONT SPEC 4OZ STRL OR WHT (MISCELLANEOUS) ×1
COVER BACK TABLE 60X90IN (DRAPES) ×1 IMPLANT
COVER MAYO STAND STRL (DRAPES) ×2 IMPLANT
COVER SURGICAL LIGHT HANDLE (MISCELLANEOUS) ×1 IMPLANT
DISPENSER GRAFT BNE VG (MISCELLANEOUS) IMPLANT
DISPENSER VIVIGEN BONE GRAFT (MISCELLANEOUS) ×1 IMPLANT
DRAIN CHANNEL 15F RND FF W/TCR (WOUND CARE) IMPLANT
DRAPE C-ARM 42X72 X-RAY (DRAPES) ×1 IMPLANT
DRAPE C-ARMOR (DRAPES) IMPLANT
DRAPE POUCH INSTRU U-SHP 10X18 (DRAPES) ×1 IMPLANT
DRAPE SURG 17X23 STRL (DRAPES) ×4 IMPLANT
DURAPREP 26ML APPLICATOR (WOUND CARE) ×1 IMPLANT
ELECT BLADE 4.0 EZ CLEAN MEGAD (MISCELLANEOUS) ×1
ELECT CAUTERY BLADE 6.4 (BLADE) ×1 IMPLANT
ELECT REM PT RETURN 9FT ADLT (ELECTROSURGICAL) ×1
ELECTRODE BLDE 4.0 EZ CLN MEGD (MISCELLANEOUS) ×1 IMPLANT
ELECTRODE REM PT RTRN 9FT ADLT (ELECTROSURGICAL) ×1 IMPLANT
EVACUATOR SILICONE 100CC (DRAIN) IMPLANT
FILTER STRAW FLUID ASPIR (MISCELLANEOUS) ×1 IMPLANT
GAUZE 4X4 16PLY ~~LOC~~+RFID DBL (SPONGE) ×1 IMPLANT
GAUZE SPONGE 4X4 12PLY STRL (GAUZE/BANDAGES/DRESSINGS) ×1 IMPLANT
GLOVE BIO SURGEON STRL SZ7 (GLOVE) ×1 IMPLANT
GLOVE BIO SURGEON STRL SZ8 (GLOVE) ×1 IMPLANT
GLOVE BIOGEL PI IND STRL 7.0 (GLOVE) ×1 IMPLANT
GLOVE BIOGEL PI IND STRL 8 (GLOVE) ×1 IMPLANT
GLOVE SURG ENC MOIS LTX SZ6.5 (GLOVE) ×1 IMPLANT
GOWN STRL REUS W/ TWL LRG LVL3 (GOWN DISPOSABLE) ×2 IMPLANT
GOWN STRL REUS W/ TWL XL LVL3 (GOWN DISPOSABLE) ×1 IMPLANT
GOWN STRL REUS W/TWL LRG LVL3 (GOWN DISPOSABLE) ×2
GOWN STRL REUS W/TWL XL LVL3 (GOWN DISPOSABLE) ×1
GRAFT BONE CANNULA VIVIGEN 3 (Bone Implant) ×3 IMPLANT
IV CATH 14GX2 1/4 (CATHETERS) ×1 IMPLANT
KIT BASIN OR (CUSTOM PROCEDURE TRAY) ×1 IMPLANT
KIT POSITION SURG JACKSON T1 (MISCELLANEOUS) ×1 IMPLANT
KIT TURNOVER KIT B (KITS) ×1 IMPLANT
MARKER SKIN DUAL TIP RULER LAB (MISCELLANEOUS) ×2 IMPLANT
NDL 18GX1X1/2 (RX/OR ONLY) (NEEDLE) ×1 IMPLANT
NDL 22X1.5 STRL (OR ONLY) (MISCELLANEOUS) ×2 IMPLANT
NDL HYPO 25GX1X1/2 BEV (NEEDLE) ×1 IMPLANT
NDL SPNL 18GX3.5 QUINCKE PK (NEEDLE) ×2 IMPLANT
NEEDLE 18GX1X1/2 (RX/OR ONLY) (NEEDLE) ×1 IMPLANT
NEEDLE 22X1.5 STRL (OR ONLY) (MISCELLANEOUS) ×2 IMPLANT
NEEDLE HYPO 25GX1X1/2 BEV (NEEDLE) ×1 IMPLANT
NEEDLE SPNL 18GX3.5 QUINCKE PK (NEEDLE) ×2 IMPLANT
NS IRRIG 1000ML POUR BTL (IV SOLUTION) ×1 IMPLANT
PACK LAMINECTOMY ORTHO (CUSTOM PROCEDURE TRAY) ×1 IMPLANT
PACK UNIVERSAL I (CUSTOM PROCEDURE TRAY) ×1 IMPLANT
PAD ARMBOARD 7.5X6 YLW CONV (MISCELLANEOUS) ×2 IMPLANT
PATTIES SURGICAL .5 X1 (DISPOSABLE) ×1 IMPLANT
PATTIES SURGICAL .5X1.5 (GAUZE/BANDAGES/DRESSINGS) ×1 IMPLANT
ROD PRE BENT EXPEDIUM 35MM (Rod) IMPLANT
SCREW SET SINGLE INNER (Screw) IMPLANT
SCREW VIPER CORT FIX 6.00X30 (Screw) IMPLANT
SEALANT PATCH FIBRIN 2X4IN (MISCELLANEOUS) IMPLANT
SPONGE INTESTINAL PEANUT (DISPOSABLE) ×1 IMPLANT
SPONGE SURGIFOAM ABS GEL 100 (HEMOSTASIS) ×1 IMPLANT
STRIP CLOSURE SKIN 1/2X4 (GAUZE/BANDAGES/DRESSINGS) ×2 IMPLANT
SURGIFLO W/THROMBIN 8M KIT (HEMOSTASIS) IMPLANT
SUT MNCRL AB 4-0 PS2 18 (SUTURE) ×1 IMPLANT
SUT VIC AB 0 CT1 18XCR BRD 8 (SUTURE) ×1 IMPLANT
SUT VIC AB 0 CT1 8-18 (SUTURE) ×1
SUT VIC AB 1 CT1 18XCR BRD 8 (SUTURE) ×1 IMPLANT
SUT VIC AB 1 CT1 8-18 (SUTURE) ×1
SUT VIC AB 2-0 CT2 18 VCP726D (SUTURE) ×1 IMPLANT
SYR 20ML LL LF (SYRINGE) ×2 IMPLANT
SYR BULB IRRIG 60ML STRL (SYRINGE) ×1 IMPLANT
SYR CONTROL 10ML LL (SYRINGE) ×2 IMPLANT
SYR TB 1ML LUER SLIP (SYRINGE) ×1 IMPLANT
TAP EXPEDIUM DL 4.35 (INSTRUMENTS) IMPLANT
TAP EXPEDIUM DL 5.0 (INSTRUMENTS) IMPLANT
TAP EXPEDIUM DL 6.0 (INSTRUMENTS) IMPLANT
TAPE CLOTH SURG 4X10 WHT LF (GAUZE/BANDAGES/DRESSINGS) IMPLANT
TRAY FOLEY MTR SLVR 16FR STAT (SET/KITS/TRAYS/PACK) ×1 IMPLANT
TUBE FUNNEL GL DISP (ORTHOPEDIC DISPOSABLE SUPPLIES) IMPLANT
WATER STERILE IRR 1000ML POUR (IV SOLUTION) ×1 IMPLANT
YANKAUER SUCT BULB TIP NO VENT (SUCTIONS) ×1 IMPLANT

## 2022-01-25 NOTE — Progress Notes (Signed)
Patient evaluated in the recovery room, appearing comfortable. Patient has full 5/5 DF and PF. Will be transferred to [facility] shortly for overnight stay and PT in the morning.

## 2022-01-25 NOTE — Anesthesia Preprocedure Evaluation (Signed)
Anesthesia Evaluation  Patient identified by MRN, date of birth, ID band Patient awake    Reviewed: Allergy & Precautions, H&P , NPO status , Patient's Chart, lab work & pertinent test results  History of Anesthesia Complications (+) PONV and history of anesthetic complications  Airway Mallampati: II  TM Distance: >3 FB Neck ROM: Full    Dental no notable dental hx.    Pulmonary neg pulmonary ROS   Pulmonary exam normal breath sounds clear to auscultation       Cardiovascular negative cardio ROS Normal cardiovascular exam Rhythm:Regular Rate:Normal     Neuro/Psych negative neurological ROS  negative psych ROS   GI/Hepatic negative GI ROS, Neg liver ROS,,,  Endo/Other  negative endocrine ROS    Renal/GU negative Renal ROS  negative genitourinary   Musculoskeletal negative musculoskeletal ROS (+)    Abdominal   Peds negative pediatric ROS (+)  Hematology negative hematology ROS (+)   Anesthesia Other Findings   Reproductive/Obstetrics negative OB ROS                             Anesthesia Physical Anesthesia Plan  ASA: 2  Anesthesia Plan: General   Post-op Pain Management: Ofirmev IV (intra-op)*   Induction: Intravenous  PONV Risk Score and Plan: 3 and Ondansetron, Dexamethasone, Droperidol and Treatment may vary due to age or medical condition  Airway Management Planned: Oral ETT  Additional Equipment:   Intra-op Plan:   Post-operative Plan: Extubation in OR  Informed Consent: I have reviewed the patients History and Physical, chart, labs and discussed the procedure including the risks, benefits and alternatives for the proposed anesthesia with the patient or authorized representative who has indicated his/her understanding and acceptance.     Dental advisory given  Plan Discussed with: CRNA and Surgeon  Anesthesia Plan Comments:        Anesthesia Quick  Evaluation

## 2022-01-25 NOTE — Op Note (Signed)
PATIENT NAME: Lauren Montgomery   MEDICAL RECORD NO.:   [id number]   DATE OF BIRTH: 05-18-1945   DATE OF PROCEDURE: 01/25/2022                               OPERATIVE REPORT     PREOPERATIVE DIAGNOSES: 1. Left-sided lumbar radiculopathy. 2. L4-5 spinal stenosis. 3. L4-5 spondylolisthesis   POSTOPERATIVE DIAGNOSES: 1. Left-sided lumbar radiculopathy. 2. L4-5 spinal stenosis. 3. L4-5 spondylolisthesis   PROCEDURES: 1. L4/5 decompression, including bilateral partial facetectomy and bilateral lateral recess decompression 2. Left-sided L4-5 transforaminal lumbar interbody fusion. 3. Rigjht-sided L4-5 posterolateral fusion. 4. Insertion of interbody device x1 (Globus expandable intervertebral spacer). 5. Placement of segmental posterior instrumentation L4, L5 bilaterally  6. Use of local autograft. 7. Use of morselized allograft - Vivigen 8. Intraoperative use of fluoroscopy.   SURGEON:  Phylliss Bob, MD.   ASSISTANTPricilla Holm, PA-C.   ANESTHESIA:  General endotracheal anesthesia.   COMPLICATIONS:  None.   DISPOSITION:  Stable.   ESTIMATED BLOOD LOSS:  150cc   INDICATIONS FOR SURGERY:  Briefly,  Lauren Montgomery is a pleasant 76 year old female who did present to me with severe and ongoing pain in the left leg. I did feel that the symptoms were secondary to the findings noted above.   The patient failed conservative care and did wish to proceed with the procedure  noted above.   OPERATIVE DETAILS:  On 01/25/2022, the patient was brought to surgery and general endotracheal anesthesia was administered.  The patient was placed prone on a well-padded flat Jackson bed with a spinal frame.  Antibiotics were given and a time-out procedure was performed. The back was prepped and draped in the usual fashion.  A midline incision was made overlying the L4-5 intervertebral spaces.  The fascia was incised at the midline.  The paraspinal musculature was bluntly swept laterally.   Anatomic landmarks for the pedicles were exposed. Using fluoroscopy, I did cannulate the L4 and L5 pedicles bilaterally, using a medial to lateral cortical trajectory technique.  At this point, 6 x 30 mm screws were placed into the right pedicles, and a 35 mm rod was placed into the tulip heads of the screws, and caps were also placed.  Distraction was then applied across the L4-5 intervertebral space, and the caps were then provisionally tightened.  On the left side, bone wax was placed into the cannulated pedicle holes.  I then proceeded with the decompressive aspect of the procedure at the L4-5 level.  A partial facetectomy was performed bilaterally at L4-5, decompressing the L4-5 intervertebral space virtually on the left, as her did remain a disc herniation.  With an assistant holding medial retraction of the traversing left L5 nerve, I did perform an annulotomy at the posterolateral aspect of the L4-5 intervertebral space.  Abundant disc material was removed, some of which was herniated into the lateral recess, compressing the left thecal sac and traversing left L5 nerve.  This was removed in its entirety, entirely decompressing the left lateral recess.  I was very pleased with the decompression.  Then proceeded with additional disc removal, and did prepare the endplates using a series of curettes.  Multiple fragments of intervertebral disc material was removed using pituitary rongeurs.  At 1 point, there was a small bleeding vessel noted at the anterior aspect of the intervertebral space.  This was packed with Gelfoam for approximately 3 to 4 minutes, and  upon removal of the Gelfoam, no suggestion of bleeding was noted.  At this point, the intervertebral space was liberally packed with autograft from the decompression, in addition to allograft, in the form of [place].  The intervertebral space was then also packed with autograft and allograft, as was the appropriate-sized intervertebral spacer.  The  spacer was then tamped into position in the usual fashion, and expanded to approximately 10.2 m in height.  I was very pleased with the press-fit of the spacer.  I then placed 6 x 30 mm screws on the left at L4 and L5. A 35-mm rod was then placed and caps were placed. The distraction was then released on the contralateral side.  All caps were then locked.  The wound was copiously irrigated with a total of approximately 3 L prior to placing the bone graft.  Additional autograft and allograft was then packed into the posterolateral gutter on the right side to help aid in the success of the fusion.  The wound was  explored for any undue bleeding and there was no substantial bleeding encountered.  Gel-Foam was placed over the laminectomy site.  The wound was then closed in layers using #1 Vicryl followed by 2-0 Vicryl, followed by 4-0 Monocryl.  Benzoin and Steri-Strips were applied followed by sterile dressing.      Of note, Pricilla Holm was my assistant throughout surgery, and did aid in retraction, suctioning, the decompression, placement of the hardware, and closure.     Phylliss Bob, MD

## 2022-01-25 NOTE — Anesthesia Procedure Notes (Signed)
Procedure Name: Intubation Date/Time: 01/25/2022 2:56 PM  Performed by: Maude Leriche, CRNAPre-anesthesia Checklist: Patient identified, Emergency Drugs available, Suction available and Patient being monitored Patient Re-evaluated:Patient Re-evaluated prior to induction Oxygen Delivery Method: Circle system utilized Preoxygenation: Pre-oxygenation with 100% oxygen Induction Type: IV induction Ventilation: Mask ventilation without difficulty Laryngoscope Size: Miller and 2 Grade View: Grade II Tube type: Oral Tube size: 6.5 mm Number of attempts: 1 Airway Equipment and Method: Stylet and Bite block Placement Confirmation: ETT inserted through vocal cords under direct vision, positive ETCO2 and breath sounds checked- equal and bilateral Secured at: 21 cm Tube secured with: Tape Dental Injury: Teeth and Oropharynx as per pre-operative assessment  Comments: Grade IIB view w/anterior pressure

## 2022-01-25 NOTE — Transfer of Care (Signed)
Immediate Anesthesia Transfer of Care Note  Patient: Lauren Montgomery  Procedure(s) Performed: LEFT-SIDED LUMBAR 4- LUMBAR 5 TRANSFORAMINAL LUMBAR  INTERBODY FUSION AND DECOMPRESSION WITH INSTRUMENTATION AND ALLOGRAFT (Left)  Patient Location: PACU  Anesthesia Type:General  Level of Consciousness: awake, alert , and oriented  Airway & Oxygen Therapy: Patient Spontanous Breathing and Patient connected to face mask oxygen  Post-op Assessment: Report given to RN, Post -op Vital signs reviewed and stable, Patient moving all extremities X 4, and Patient able to stick tongue midline  Post vital signs: Reviewed  Last Vitals:  Vitals Value Taken Time  BP 124/56 01/25/22 1740  Temp 97.3   Pulse 72 01/25/22 1745  Resp 11 01/25/22 1745  SpO2 100 % 01/25/22 1745  Vitals shown include unvalidated device data.  Last Pain:  Vitals:   01/25/22 1214  TempSrc:   PainSc: 6       Patients Stated Pain Goal: 0 (21/19/41 7408)  Complications: No notable events documented.

## 2022-01-25 NOTE — Anesthesia Postprocedure Evaluation (Signed)
Anesthesia Post Note  Patient: Lauren Montgomery  Procedure(s) Performed: LEFT-SIDED LUMBAR 4- LUMBAR 5 TRANSFORAMINAL LUMBAR  INTERBODY FUSION AND DECOMPRESSION WITH INSTRUMENTATION AND ALLOGRAFT (Left)     Patient location during evaluation: PACU Anesthesia Type: General Level of consciousness: awake and alert Pain management: pain level controlled Vital Signs Assessment: post-procedure vital signs reviewed and stable Respiratory status: spontaneous breathing, nonlabored ventilation, respiratory function stable and patient connected to nasal cannula oxygen Cardiovascular status: blood pressure returned to baseline and stable Postop Assessment: no apparent nausea or vomiting Anesthetic complications: no  No notable events documented.  Last Vitals:  Vitals:   01/25/22 1915 01/25/22 1941  BP: (!) 122/54 (!) 144/65  Pulse: (!) 57 65  Resp: 18 18  Temp: 36.7 C 36.6 C  SpO2: 97% 97%    Last Pain:  Vitals:   01/25/22 2000  TempSrc:   PainSc: 0-No pain                 Barnet Glasgow

## 2022-01-25 NOTE — H&P (Signed)
PREOPERATIVE H&P  Chief Complaint: Left leg pain  HPI: Lauren Montgomery is a 76 y.o. female who presents with ongoing pain in the left leg  MRI reveals severe stenosis and L4/5 in addition to instability  Patient has failed multiple forms of conservative care and continues to have pain (see office notes for additional details regarding the patient's full course of treatment)  Past Medical History:  Diagnosis Date   Cataract    left eye   Degenerative joint disease    Exposure to TB    "mother died of TB; I've never tested positive for it" (10/12/2015)   High cholesterol    History of blood transfusion    with childbirth   Osteoarthritis    PONV (postoperative nausea and vomiting)    Skin cancer    nose   Past Surgical History:  Procedure Laterality Date   BONE ANCHORED HEARING AID IMPLANT Left 2005   BUNIONECTOMY Bilateral    feet   CARPAL TUNNEL RELEASE Right    JOINT REPLACEMENT     KNEE ARTHROPLASTY Left 10/12/2015   SKIN CANCER EXCISION     "tip of my nose; not melanoma"   TOTAL HIP ARTHROPLASTY  03/14/2011   Procedure: TOTAL HIP ARTHROPLASTY;  Surgeon: [name], MD;  Location: [place];  Service: Orthopedics;  Laterality: Left;  [place] Right 09/02/2013   Procedure: TOTAL HIP ARTHROPLASTY ANTERIOR APPROACH: Disolacted trial head resection muscular abdominal inguinal ligament.;  Surgeon: [name], MD;  Location: [place];  Service: Orthopedics;  Laterality: Right;   TOTAL KNEE ARTHROPLASTY Right 03/24/2014   Procedure: RIGHT TOTAL KNEE ARTHROPLASTY;  Surgeon: [name], MD;  Location: [place];  Service: Orthopedics;  Laterality: Right;   TOTAL KNEE ARTHROPLASTY Left 10/12/2015   Procedure: TOTAL KNEE ARTHROPLASTY;  Surgeon: [name], MD;  Location: [place];  Service: Orthopedics;  Laterality: Left;   TUBAL LIGATION     Social History   Socioeconomic History   Marital status: Married    Spouse name:  Kyung Rudd   Number of children: 2   Years of education: Not on file   Highest education level: Not on file  Occupational History   Not on file  Tobacco Use   Smoking status: Never   Smokeless tobacco: Never  Vaping Use   Vaping Use: Never used  Substance and Sexual Activity   Alcohol use: No   Drug use: No   Sexual activity: Never  Other Topics Concern   Not on file  Social History Narrative   Not on file   Social Determinants of Health   Financial Resource Strain: Not on file  Food Insecurity: Not on file  Transportation Needs: Not on file  Physical Activity: Not on file  Stress: Not on file  Social Connections: Not on file   Family History  Adopted: Yes   Allergies  Allergen Reactions   Codeine Nausea Only   Prior to Admission medications   Medication Sig Start Date End Date Taking? Authorizing Provider  acetaminophen (TYLENOL) 500 MG tablet Take 500-1,000 mg by mouth every 6 (six) hours as needed for moderate pain.   Yes [provider]  calcium carbonate (TUMS - DOSED IN MG ELEMENTAL CALCIUM) 500 MG chewable tablet Chew 500 mg by mouth daily as needed for indigestion or heartburn.   Yes [provider]  Coenzyme Q10 (COQ10) 100 MG CAPS Take 100 mg by  mouth daily.   Yes [provider]  gabapentin (NEURONTIN) 300 MG capsule Take 1 capsule by mouth every other day. 09/25/21  Yes [provider]  Multiple Vitamins-Minerals (CENTRUM SILVER 50+WOMEN PO) Take 1 tablet by mouth daily.   Yes [provider]  Omega-3 Fatty Acids (FISH OIL) 1000 MG CAPS Take 1,000 mg by mouth daily.   Yes [provider]  Polyethyl Glycol-Propyl Glycol (SYSTANE OP) Place 1 drop into both eyes daily as needed (dry eyes).   Yes [provider]  rosuvastatin (CRESTOR) 5 MG tablet Take 5 mg by mouth daily.   Yes [provider]     All other systems have been reviewed and were otherwise negative with the exception of those  mentioned in the HPI and as above.  Physical Exam: There were no vitals filed for this visit.  There is no height or weight on file to calculate BMI.  General: Alert, no acute distress Cardiovascular: No pedal edema Respiratory: No cyanosis, no use of accessory musculature Skin: No lesions in the area of chief complaint Neurologic: Sensation intact distally Psychiatric: Patient is competent for consent with normal mood and affect Lymphatic: No axillary or cervical lymphadenopathy   Assessment/Plan: Left-sided lumbar radiculopathy secondary to the patient's instability and stenosis at L4-L5. Plan for Procedure(s): LEFT-SIDED LUMBAR 4- LUMBAR 5 TRANSFORAMINAL LUMBAR  INTERBODY FUSION AND DECOMPRESSION WITH INSTRUMENTATION AND ALLOGRAFT   Norva Karvonen, MD 01/25/2022 6:42 AM

## 2022-01-26 DIAGNOSIS — M48061 Spinal stenosis, lumbar region without neurogenic claudication: Secondary | ICD-10-CM | POA: Diagnosis not present

## 2022-01-26 MED ORDER — METHOCARBAMOL 500 MG PO TABS: 500.0000 mg | ORAL_TABLET | Freq: Four times a day (QID) | ORAL | 2 refills | Status: DC | PRN

## 2022-01-26 MED ORDER — ONDANSETRON HCL 4 MG PO TABS: 4.0000 mg | ORAL_TABLET | Freq: Four times a day (QID) | ORAL | 0 refills | Status: DC | PRN

## 2022-01-26 MED ORDER — OXYCODONE-ACETAMINOPHEN 5-325 MG PO TABS: 1.0000 | ORAL_TABLET | ORAL | 0 refills | Status: DC | PRN

## 2022-01-26 NOTE — Progress Notes (Signed)
    Patient doing well PO day 1 from L4-5 TLIF. Resolved leg pain, expected PO LBP. She has been up and walking. Has been eating and drinking OK today, some N/V yesterday PO. Tolerating pain meds well at this venture and eager to progress home.    Physical Exam:BP (!) 115/54 (BP Location: Left Arm)   Pulse 78   Temp 98.8 F (37.1 C) (Oral)   Resp 20   Ht '5\' 3"'$  (1.6 m)   Wt 72.6 kg   SpO2 93%   BMI 28.34 kg/m    Dressing in place, CDI, abdomen soft, pt resting comfortably in bed.  NVI  POD #1 s/p L4-5 TLIF doing well  - up with PT/OT, encourage ambulation - Percocet for pain, Robaxin for muscle spasms, zofran PRN nausea  -Sent to pharm elec. - likely d/c home today with f/u in 2 weeks

## 2022-01-26 NOTE — Plan of Care (Signed)
Patient alert and oriented, Void, VSS, ambulate. Surgical site clean and dry no sign of infections. D/c instructions explain and given to the patient all questions answered. Pt d/c home per order Problem: Education: Goal: Ability to verbalize activity precautions or restrictions will improve Outcome: Completed/Met Goal: Knowledge of the prescribed therapeutic regimen will improve Outcome: Completed/Met Goal: Understanding of discharge needs will improve Outcome: Completed/Met

## 2022-01-26 NOTE — TOC Transition Note (Addendum)
Transition of Care [facility]) - CM/SW Discharge Note   Patient Details  Name: Lauren Montgomery MRN: [id number] Date of Birth: 21-Apr-1945  [facility] CM/SW Contact:  Tom-Johnson, Renea Ee, RN Phone Number: 01/26/2022, 11:03 AM   Clinical Narrative:     Patient is scheduled for discharge today. Home health referral called in to [facility] as patient and family stated no preference. Cory voiced acceptance, info on AVS. Patient has all necessary DME's at home. Denies any other needs. Family to transport at discharge. No further TOC needs noted.     Final next level of care: [place] Barriers to Discharge: Barriers Resolved   Patient Goals and CMS Choice Patient states their goals for this hospitalization and ongoing recovery are:: To return home CMS Medicare.gov Compare Post Acute Care list provided to:: Patient Choice offered to / list presented to : Patient, Spouse, Adult Children (Daughter)  Discharge Placement                Patient to be transferred to facility by: Family      Discharge Plan and Services                DME Arranged: N/A DME Agency: NA       HH Arranged: PT, OT HH Agency: [place] Date [place]: 01/26/22 Time [place]: 2162 Representative spoke with at [place]: [place] ([place]) Interventions     Readmission Risk Interventions     No data to display

## 2022-01-26 NOTE — Evaluation (Signed)
Physical Therapy Evaluation  Patient Details Name: Lauren Montgomery MRN: [id number] DOB: 11/08/1945 Today's Date: 01/26/2022  History of Present Illness  Pt is a 76 y.o. female presenting with severe and ongoing pain in the L leg and has failed conservative treatment. Pt is now s/p L sided Lumbar 4-5 interbody fusion and decompression with instrumentation and allograft on 01/25/2022. PMH significant for osteoarthritis, R carpal tunnel release, R total knee arthroplasty, L total knee arthroplasty, R total hip arthroplasty.   Clinical Impression  Pt admitted with above diagnosis. At the time of PT eval, pt was able to demonstrate transfers and ambulation with gross min assist and RW for support. Pt was educated on precautions, brace application/wearing schedule, appropriate activity progression, and car transfer. Pt currently with functional limitations due to the deficits listed below (see PT Problem List). Pt will benefit from skilled PT to increase their independence and safety with mobility to allow discharge to the venue listed below.         Recommendations for follow up therapy are one component of a multi-disciplinary discharge planning process, led by the attending physician.  Recommendations may be updated based on patient status, additional functional criteria and insurance authorization.  Follow Up Recommendations Home health PT      Assistance Recommended at Discharge Frequent or constant Supervision/Assistance  Patient can return home with the following  A little help with walking and/or transfers;A little help with bathing/dressing/bathroom;Assistance with cooking/housework;Assist for transportation;Help with stairs or ramp for entrance    Equipment Recommendations None recommended by PT  Recommendations for Other Services       Functional Status Assessment Patient has had a recent decline in their functional status and demonstrates the ability to make significant improvements  in function in a reasonable and predictable amount of time.     Precautions / Restrictions Precautions Precautions: Back Precaution Booklet Issued: Yes (comment) Precaution Comments: All education reviewed within the context of ADL Required Braces or Orthoses: Spinal Brace Spinal Brace: Thoracolumbosacral orthotic Restrictions Weight Bearing Restrictions: No      Mobility  Bed Mobility               General bed mobility comments: Pt was received sitting up in the recliner.    Transfers Overall transfer level: Needs assistance Equipment used: Rolling walker (2 wheels) Transfers: Sit to/from Stand Sit to Stand: Min assist           General transfer comment: VC's for hand placement on seated surface for safety. Assist for power up to full stand.    Ambulation/Gait Ambulation/Gait assistance: Min guard Gait Distance (Feet): 200 Feet Assistive device: Rolling walker (2 wheels) Gait Pattern/deviations: Step-through pattern, Decreased stride length, Trunk flexed Gait velocity: Decreased Gait velocity interpretation: <1.31 ft/sec, indicative of household ambulator   General Gait Details: Slow and guarded but without overt LOB with RW for support.  Stairs Stairs: Yes Stairs assistance: Mod assist, Min assist Stair Management: One rail Right, Step to pattern, Forwards Number of Stairs: 3 General stair comments: Initially advanced up with the LLE instead of the stronger RLE as instructed and required mod assist to boost up and safely advance to next step. When pt lead up with the RLE, light min assist provided for safety.  Wheelchair Mobility    Modified Rankin (Stroke Patients Only)       Balance Overall balance assessment: Needs assistance Sitting-balance support: Single extremity supported, Bilateral upper extremity supported, No upper extremity supported, Feet supported Sitting balance-Leahy Scale: Fair  Sitting balance - Comments: Initially min guard A.    Standing balance support: Single extremity supported, Bilateral upper extremity supported, During functional activity Standing balance-Leahy Scale: Poor Standing balance comment: At least min guard A during standing                             Pertinent Vitals/Pain Pain Assessment Pain Assessment: Faces Faces Pain Scale: Hurts little more Pain Location: Incision site Pain Descriptors / Indicators: Sore, Operative site guarding Pain Intervention(s): Limited activity within patient's tolerance, Monitored during session, Repositioned    Home Living Family/patient expects to be discharged to:: Private residence Living Arrangements: Spouse/significant other Available Help at Discharge: Family Type of Home: House Home Access: Stairs to enter Entrance Stairs-Rails: Right Entrance Stairs-Number of Steps: 2 to sidewalk then 3 into door   Home Layout: One level Home Equipment: Conservation officer, nature (2 wheels);Cane - single point;BSC/3in1;Tub bench;Adaptive equipment;Hand held shower head (electric bed)      Prior Function Prior Level of Function : Needs assist             Mobility Comments: cane ADLs Comments: Pt reports husband assists with socks at baseline, but otherwise desceibes herself as independent.     Hand Dominance   Dominant Hand: Right    Extremity/Trunk Assessment   Upper Extremity Assessment Upper Extremity Assessment: Defer to OT evaluation    Lower Extremity Assessment Lower Extremity Assessment: Generalized weakness;LLE deficits/detail LLE Deficits / Details: Decreased strength, AROM, and muscular endurance consistent with pre-op diagnosis    Cervical / Trunk Assessment Cervical / Trunk Assessment: Back Surgery  Communication   Communication: No difficulties  Cognition Arousal/Alertness: Awake/alert Behavior During Therapy: WFL for tasks assessed/performed Overall Cognitive Status: Impaired/Different from baseline Area of Impairment:  Safety/judgement, Problem solving                         Safety/Judgement: Decreased awareness of safety   Problem Solving: Slow processing, Requires verbal cues, Requires tactile cues General Comments: Slow to respond and does not appear to be comprehending education at times.        General Comments General comments (skin integrity, edema, etc.): VSS. Daughter present and would like HHOT upon discharge    Exercises     Assessment/Plan    PT Assessment Patient needs continued PT services  PT Problem List Decreased strength;Decreased activity tolerance;Decreased balance;Decreased mobility;Decreased knowledge of use of DME;Decreased safety awareness;Decreased knowledge of precautions;Pain       PT Treatment Interventions DME instruction;Gait training;Functional mobility training;Stair training;Therapeutic activities;Therapeutic exercise;Neuromuscular re-education;Patient/family education    PT Goals (Current goals can be found in the Care Plan section)  Acute Rehab PT Goals Patient Stated Goal: Home today PT Goal Formulation: With patient/family Time For Goal Achievement: 02/02/22 Potential to Achieve Goals: Good    Frequency Min 5X/week     Co-evaluation               AM-PAC PT "6 Clicks" Mobility  Outcome Measure Help needed turning from your back to your side while in a flat bed without using bedrails?: A Little Help needed moving from lying on your back to sitting on the side of a flat bed without using bedrails?: A Little Help needed moving to and from a bed to a chair (including a wheelchair)?: A Little Help needed standing up from a chair using your arms (e.g., wheelchair or bedside chair)?: A Little Help needed to  walk in hospital room?: A Little Help needed climbing 3-5 steps with a railing? : A Little 6 Click Score: 18    End of Session Equipment Utilized During Treatment: Gait belt;Back brace Activity Tolerance: Patient tolerated treatment  well Patient left:  (Sitting EOB with family present to assist her with dressing.)   PT Visit Diagnosis: Unsteadiness on feet (R26.81);Pain Pain - part of body:  (back)    Time: 7939-6886 PT Time Calculation (min) (ACUTE ONLY): 14 min   Charges:   PT Evaluation $PT Eval Low Complexity: [place], PT, DPT Acute Rehabilitation Services Secure Chat Preferred Office: [phone]   Thelma Comp 01/26/2022, 12:17 PM

## 2022-01-26 NOTE — Evaluation (Signed)
Occupational Therapy Evaluation Patient Details Name: Lauren Montgomery MRN: [id number] DOB: Jul 07, 1945 Today's Date: 01/26/2022   History of Present Illness Pt is a 76 y.o. female presenting with severe and ongoing pain in the L leg and has failed conservative treatment. Pt is now s/p L sided Lumbar 4-5 interbody fusion and decompression with instrumentation and allograft on 01/25/2022. PMH significant for osteoarthritis, R carpal tunnel release, R total knee arthroplasty, L total knee arthroplasty, R total hip arthroplasty.   Clinical Impression   PTA, pt reports she lived with her husband who provided assistance with LB dressing. Upon eval, pt with decreased recall of spinal precautions, memory, pain, safety, and strength. Pt performing UB ADL with set-up, but requires max A for brace application, and LB ADL with up to mod A. Pt educated and demonstrating use of compensatory techniques for LB dressing, brace application, bed mobility, shower transfer, grooming, and toileting. Pt with max comments regarding her inability to recall education provided stating "that is what my daughter and husband are for" and reporting "they will have to make me get up". Pt with decreased ability to recall education provided during session, and with decreased adherence to precautions during session. Therefore, recommending HHOT to optimize safety and independence in ADL and IADL.      Recommendations for follow up therapy are one component of a multi-disciplinary discharge planning process, led by the attending physician.  Recommendations may be updated based on patient status, additional functional criteria and insurance authorization.   Follow Up Recommendations  Home health OT     Assistance Recommended at Discharge Intermittent Supervision/Assistance  Patient can return home with the following A little help with walking and/or transfers;A little help with bathing/dressing/bathroom;Assistance with  cooking/housework;Direct supervision/assist for financial management;Direct supervision/assist for medications management;Assist for transportation;Help with stairs or ramp for entrance    Functional Status Assessment  Patient has had a recent decline in their functional status and demonstrates the ability to make significant improvements in function in a reasonable and predictable amount of time.  Equipment Recommendations  None recommended by OT (Pt has all needed equipment)    Recommendations for Other Services       Precautions / Restrictions Precautions Precautions: Back Precaution Booklet Issued: Yes (comment) Precaution Comments: All education reviewed within the context of ADL Required Braces or Orthoses: Spinal Brace Spinal Brace: Thoracolumbosacral orthotic Restrictions Weight Bearing Restrictions: No      Mobility Bed Mobility Overal bed mobility: Needs Assistance Bed Mobility: Rolling, Sidelying to Sit Rolling: Min assist Sidelying to sit: Min guard       General bed mobility comments: Min gaurd A for safety with rise. Min A to maintain precautions during rolling    Transfers Overall transfer level: Needs assistance Equipment used: 1 person hand held assist Transfers: Sit to/from Stand Sit to Stand: Min guard           General transfer comment: Increaesd time. Heavy reliance on HHA with therapist acting as cane      Balance Overall balance assessment: Needs assistance Sitting-balance support: Single extremity supported, Bilateral upper extremity supported, No upper extremity supported, Feet supported Sitting balance-Leahy Scale: Fair Sitting balance - Comments: Initially min guard A.   Standing balance support: Single extremity supported, Bilateral upper extremity supported, During functional activity Standing balance-Leahy Scale: Poor Standing balance comment: At least min guard A during standing  ADL either  performed or assessed with clinical judgement   ADL Overall ADL's : Needs assistance/impaired Eating/Feeding: Supervision/ safety;Sitting Eating/Feeding Details (indicate cue type and reason): Initially requiring supervision sititng EOB as pt with eyes closed and observed with min wobbling Grooming: Min guard;Standing Grooming Details (indicate cue type and reason): educated regarding compensatory techniques Upper Body Bathing: Set up;Sitting   Lower Body Bathing: Min guard;Sit to/from stand;With adaptive equipment Lower Body Bathing Details (indicate cue type and reason): Min guard A for safety Upper Body Dressing : Maximal assistance;Sitting Upper Body Dressing Details (indicate cue type and reason): for brace application Lower Body Dressing: Moderate assistance;Sit to/from stand Lower Body Dressing Details (indicate cue type and reason): Mod A for socks. Able to use sock aid, and has one, but reporting she does not use at home, and husband assists at home Toilet Transfer: Curtice guard;Rolling walker (2 wheels);Ambulation (1 person hand held assist initially as pt does not have her cane)     Toileting - Clothing Manipulation Details (indicate cue type and reason): educating regarding compensatory techniques Tub/ Shower Transfer: Walk-in shower;Minimal assistance;Ambulation;Shower seat (1  person HHA) Clinical cytogeneticist Details (indicate cue type and reason): Min A to bring R foot over lip. Functional mobility during ADLs: Min guard;Cane;Rolling walker (2 wheels)       Vision Patient Visual Report: No change from baseline Vision Assessment?: No apparent visual deficits Additional Comments: WFL for tasks assessed     Perception Perception Perception Tested?: No   Praxis Praxis Praxis tested?: Not tested    Pertinent Vitals/Pain Pain Assessment Pain Assessment: 0-10 Pain Score: 6  Pain Location: operative site Pain Descriptors / Indicators: Sore, Operative site guarding Pain  Intervention(s): Limited activity within patient's tolerance, Monitored during session, Relaxation     Hand Dominance     Extremity/Trunk Assessment Upper Extremity Assessment Upper Extremity Assessment: Generalized weakness   Lower Extremity Assessment Lower Extremity Assessment: Defer to PT evaluation   Cervical / Trunk Assessment Cervical / Trunk Assessment: Back Surgery   Communication Communication Communication: No difficulties   Cognition Arousal/Alertness: Awake/alert, Lethargic (initially lethargic) Behavior During Therapy: WFL for tasks assessed/performed Overall Cognitive Status: Difficult to assess                                 General Comments: Unsure if due to pain medication/post anesthesia, but pt with decrased recall of any information previously provided by nursing staff. Pt requiring increased time to answer all questions regarding home set-up and daughter assisting with majority of questions. Pt reporting "I am not going to remember any of this, but thats what she (daughter) and my husband are for". Benefiting from max education on the importance of walking following spinal surgery.     General Comments  VSS. Daughter present and would like HHOT upon discharge    Exercises     Shoulder Instructions      Home Living Family/patient expects to be discharged to:: Private residence Living Arrangements: Spouse/significant other Available Help at Discharge: Family Type of Home: House Home Access: Stairs to enter [employer] of Steps: 2 to sidewalk then 3 into door Entrance Stairs-Rails:  (R in the back, L in the front?) Home Layout: One level     Bathroom Shower/Tub: Tub/shower unit;Walk-in shower   Bathroom Toilet: Standard     Home Equipment: Conservation officer, nature (2 wheels);Cane - single point;BSC/3in1;Tub bench;Adaptive equipment;Hand held shower head (electric bed) Adaptive Equipment: Reacher;Sock aid;Long-handled  shoe horn         Prior Functioning/Environment Prior Level of Function : Needs assist             Mobility Comments: cane ADLs Comments: Pt reports husband assists with socks at baseline, but otherwise desceibes herself as independent.        OT Problem List: Decreased strength;Decreased activity tolerance;Impaired balance (sitting and/or standing);Decreased range of motion;Decreased cognition;Decreased safety awareness;Decreased knowledge of use of DME or AE;Decreased knowledge of precautions;Pain      OT Treatment/Interventions: Self-care/ADL training;Therapeutic exercise;DME and/or AE instruction;Therapeutic activities;Cognitive remediation/compensation;Patient/family education;Balance training    OT Goals(Current goals can be found in the care plan section) Acute Rehab OT Goals Patient Stated Goal: go home OT Goal Formulation: With patient Time For Goal Achievement: 02/09/22 Potential to Achieve Goals: Good  OT Frequency: Min 2X/week    Co-evaluation              AM-PAC OT "6 Clicks" Daily Activity     Outcome Measure Help from another person eating meals?: A Little Help from another person taking care of personal grooming?: A Little Help from another person toileting, which includes using toliet, bedpan, or urinal?: A Little Help from another person bathing (including washing, rinsing, drying)?: A Little Help from another person to put on and taking off regular upper body clothing?: A Little Help from another person to put on and taking off regular lower body clothing?: A Lot 6 Click Score: 17   End of Session Equipment Utilized During Treatment: Gait belt;Rolling walker (2 wheels);Back brace (cane) Nurse Communication: Mobility status  Activity Tolerance: Patient tolerated treatment well Patient left: in bed;with call bell/phone within reach;with family/visitor present  OT Visit Diagnosis: Unsteadiness on feet (R26.81);Muscle weakness (generalized) (M62.81);Other  abnormalities of gait and mobility (R26.89);Other symptoms and signs involving cognitive function;Pain Pain - part of body:  (back)                Time: 0828-0900 OT Time Calculation (min): 32 min Charges:  OT General Charges $OT Visit: 1 Visit OT Evaluation $OT Eval Low Complexity: 1 Low OT Treatments $Self Care/Home Management : 8-22 mins  Elder Cyphers, OTR/L [facility] Acute Rehabilitation Office: [phone]    Magnus Ivan 01/26/2022, 9:52 AM

## 2022-01-26 NOTE — Care Management Obs Status (Signed)
[facility] NOTIFICATION   Patient Details  Name: Lauren Montgomery MRN: [id number] Date of Birth: 1945-09-14   Medicare Observation Status Notification Given:  Yes    Tom-Johnson, Renea Ee, RN 01/26/2022, 9:12 AM

## 2022-01-31 NOTE — Discharge Summary (Signed)
Patient ID: Lauren Montgomery MRN: [id number] DOB/AGE: 1946/01/27 76 y.o.  Admit date: 01/25/2022 Discharge date: 01/26/2022  Admission Diagnoses:  Principal Problem:   Radiculopathy, lumbar region   Discharge Diagnoses:  Same  Past Medical History:  Diagnosis Date   Cataract    left eye   Degenerative joint disease    Exposure to TB    "mother died of TB; I've never tested positive for it" (10/12/2015)   High cholesterol    History of blood transfusion    with childbirth   Osteoarthritis    PONV (postoperative nausea and vomiting)    Skin cancer    nose    Surgeries: Procedure(s): LEFT-SIDED LUMBAR 4- LUMBAR 5 TRANSFORAMINAL LUMBAR  INTERBODY FUSION AND DECOMPRESSION WITH INSTRUMENTATION AND ALLOGRAFT on 01/25/2022   Consultants: None  Discharged Condition: Improved  Hospital Course: Lauren Montgomery is an 76 y.o. female who was admitted 01/25/2022 for operative treatment of Radiculopathy, lumbar region. Patient has severe unremitting pain that affects sleep, daily activities, and work/hobbies. After pre-op clearance the patient was taken to the operating room on 01/25/2022 and underwent  Procedure(s): LEFT-SIDED LUMBAR 4- LUMBAR 5 TRANSFORAMINAL LUMBAR  INTERBODY FUSION AND DECOMPRESSION WITH INSTRUMENTATION AND ALLOGRAFT.    Patient was given perioperative antibiotics:  Anti-infectives (From admission, onward)    Start     Dose/Rate Route Frequency Ordered Stop   01/25/22 1930  ceFAZolin (ANCEF) IVPB 2g/100 mL premix        2 g 200 mL/hr over 30 Minutes Intravenous Every 8 hours 01/25/22 1919 01/26/22 0452   01/25/22 1200  ceFAZolin (ANCEF) IVPB 2g/100 mL premix        2 g 200 mL/hr over 30 Minutes Intravenous On call to O.R. 01/25/22 1155 01/25/22 1458        Patient was given sequential compression devices, early ambulation to prevent DVT.  Patient benefited maximally from hospital stay and there were no complications.    Recent vital signs: BP (!)  115/54 (BP Location: Left Arm)   Pulse 78   Temp 98.8 F (37.1 C) (Oral)   Resp 20   Ht '5\' 3"'$  (1.6 m)   Wt 72.6 kg   SpO2 93%   BMI 28.34 kg/m    Discharge Medications:   Allergies as of 01/26/2022       Reactions   Codeine Nausea Only        Medication List     STOP taking these medications    acetaminophen 500 MG tablet Commonly known as: TYLENOL       TAKE these medications    calcium carbonate 500 MG chewable tablet Commonly known as: TUMS - dosed in mg elemental calcium Chew 500 mg by mouth daily as needed for indigestion or heartburn.   CENTRUM SILVER 50+WOMEN PO Take 1 tablet by mouth daily.   gabapentin 300 MG capsule Commonly known as: NEURONTIN Take 1 capsule by mouth every other day.   methocarbamol 500 MG tablet Commonly known as: ROBAXIN Take 1-2 tablets (500-1,000 mg total) by mouth every 6 (six) hours as needed for muscle spasms.   ondansetron 4 MG tablet Commonly known as: ZOFRAN Take 1 tablet (4 mg total) by mouth every 6 (six) hours as needed for nausea or vomiting.   oxyCODONE-acetaminophen 5-325 MG tablet Commonly known as: PERCOCET/ROXICET Take 1-2 tablets by mouth every 4 (four) hours as needed for severe pain.   rosuvastatin 5 MG tablet Commonly known as: CRESTOR Take 5 mg by mouth  daily.   SYSTANE OP Place 1 drop into both eyes daily as needed (dry eyes).        Diagnostic Studies: DG Lumbar Spine 2-3 Views  Result Date: 01/25/2022 CLINICAL DATA:  L4-5 fusion EXAM: LUMBAR SPINE - 2-3 VIEW COMPARISON:  01/25/2022 FINDINGS: Two fluoroscopic images are obtained during the performance of the procedure and are provided for interpretation only. Posterior fusion hardware and disc spacer are seen at the L4-5 level. Alignment is anatomic. Please refer to the operative report. Fluoroscopy time: 43 seconds, 18.04 mGy IMPRESSION: 1. Intraoperative evaluation from L4-5 discectomy and posterior fusion. Electronically Signed   By: Randa Ngo M.D.   On: 01/25/2022 18:12   DG C-Arm 1-60 Min-No Report  Result Date: 01/25/2022 Fluoroscopy was utilized by the requesting physician.  No radiographic interpretation.   DG C-Arm 1-60 Min-No Report  Result Date: 01/25/2022 Fluoroscopy was utilized by the requesting physician.  No radiographic interpretation.   DG C-Arm 1-60 Min-No Report  Result Date: 01/25/2022 Fluoroscopy was utilized by the requesting physician.  No radiographic interpretation.   DG Lumbar Spine 1 View  Result Date: 01/25/2022 CLINICAL DATA:  Localization EXAM: LUMBAR SPINE - 1 VIEW COMPARISON:  None Available. FINDINGS: Localization instruments seen posterior to the L5 and L3 vertebral bodies. Multilevel degenerative disc disease soft tissues are unremarkable. IMPRESSION: Localization instruments are posterior to the L5 and L3 vertebral bodies. Electronically Signed   By: Yetta Glassman M.D.   On: 01/25/2022 15:28    Disposition: Discharge disposition: 01-Home or Self Care       Discharge Instructions     Discharge patient   Complete by: As directed    Discharge disposition: 01-Home or Self Care   Discharge patient date: 01/26/2022      POD #1 s/p L4-5 TLIF doing well   - up with PT/OT, encourage ambulation - Percocet for pain, Robaxin for muscle spasms, zofran PRN nausea -Scripts for pain sent to pharmacy electronically  -D/C instructions sheet printed and in chart -D/C today  -F/U in office 2 weeks   Signed: Lennie Muckle Lynel Forester 01/31/2022, 1:56 PM

## 2022-02-01 ENCOUNTER — Encounter (HOSPITAL_COMMUNITY): Payer: Self-pay | Admitting: Orthopedic Surgery

## 2023-01-15 ENCOUNTER — Other Ambulatory Visit: Payer: Self-pay

## 2023-01-15 ENCOUNTER — Emergency Department (HOSPITAL_COMMUNITY)
Admission: EM | Admit: 2023-01-15 | Discharge: 2023-01-15 | Disposition: A | Discharge status: Home / Self Care | Payer: Medicare Other | Attending: Emergency Medicine | Admitting: Emergency Medicine

## 2023-01-15 ENCOUNTER — Emergency Department (HOSPITAL_COMMUNITY): Payer: Medicare Other

## 2023-01-15 ENCOUNTER — Encounter (HOSPITAL_COMMUNITY): Payer: Self-pay | Admitting: Emergency Medicine

## 2023-01-15 DIAGNOSIS — Z96643 Presence of artificial hip joint, bilateral: Secondary | ICD-10-CM | POA: Diagnosis not present

## 2023-01-15 DIAGNOSIS — S42291A Other displaced fracture of upper end of right humerus, initial encounter for closed fracture: Secondary | ICD-10-CM | POA: Insufficient documentation

## 2023-01-15 DIAGNOSIS — Z85828 Personal history of other malignant neoplasm of skin: Secondary | ICD-10-CM | POA: Insufficient documentation

## 2023-01-15 DIAGNOSIS — M25511 Pain in right shoulder: Secondary | ICD-10-CM | POA: Diagnosis present

## 2023-01-15 DIAGNOSIS — W010XXA Fall on same level from slipping, tripping and stumbling without subsequent striking against object, initial encounter: Secondary | ICD-10-CM | POA: Diagnosis not present

## 2023-01-15 DIAGNOSIS — Z96653 Presence of artificial knee joint, bilateral: Secondary | ICD-10-CM | POA: Insufficient documentation

## 2023-01-15 MED ORDER — OXYCODONE HCL 5 MG PO TABS: 5.0000 mg | ORAL_TABLET | Freq: Four times a day (QID) | ORAL | 0 refills | Status: AC | PRN

## 2023-01-15 MED ORDER — HYDROMORPHONE HCL 1 MG/ML IJ SOLN
0.5000 mg | Freq: Once | INTRAMUSCULAR | Status: AC
  Administered 2023-01-15: 0.5 mg via INTRAVENOUS

## 2023-01-15 MED ORDER — HYDROMORPHONE HCL 1 MG/ML IJ SOLN
INTRAMUSCULAR | Status: AC
  Filled 2023-01-15: qty 1

## 2023-01-15 MED ORDER — ONDANSETRON HCL 4 MG/2ML IJ SOLN
4.0000 mg | Freq: Once | INTRAMUSCULAR | Status: AC
  Administered 2023-01-15: 4 mg via INTRAVENOUS
  Filled 2023-01-15: qty 2

## 2023-01-15 NOTE — ED Notes (Signed)
Pt c/o nausea with standing. MD notified.

## 2023-01-15 NOTE — ED Provider Notes (Signed)
[place] EMERGENCY DEPARTMENT AT [facility] Provider Note  CSN: [id number] Arrival date & time: 01/15/23 2021  Chief Complaint(s) Fall  HPI Lauren Montgomery is a 77 y.o. female without significant past medical history presenting to the emergency department with fall.  Patient reports she fell landing on her right shoulder.  Patient denies any head injury.  No numbness or tingling.  No pain in her legs or left arm.  No neck or back pain.  This occurred today.  She tripped on lumber in her garage.  Symptoms are moderate.  She received pain medication with EMS.   Past Medical History Past Medical History:  Diagnosis Date   Cataract    left eye   Degenerative joint disease    Exposure to TB    "mother died of TB; I've never tested positive for it" (10/12/2015)   High cholesterol    History of blood transfusion    with childbirth   Osteoarthritis    PONV (postoperative nausea and vomiting)    Skin cancer    nose   Patient Active Problem List   Diagnosis Date Noted   Radiculopathy, lumbar region 01/25/2022   Primary localized osteoarthritis of left knee 10/12/2015   DJD (degenerative joint disease) of knee 03/24/2014   Primary localized osteoarthrosis, pelvic region and thigh 09/02/2013   Home Medication(s) Prior to Admission medications   Medication Sig Start Date End Date Taking? Authorizing Provider  calcium carbonate (TUMS - DOSED IN MG ELEMENTAL CALCIUM) 500 MG chewable tablet Chew 500 mg by mouth daily as needed for indigestion or heartburn.   Yes [provider]  Coenzyme Q10 (COQ-10) 100 MG CAPS Take 1 capsule by mouth daily.   Yes [provider]  Multiple Vitamins-Minerals (CENTRUM SILVER 50+WOMEN PO) Take 1 tablet by mouth daily.   Yes [provider]  Omega-3 Fatty Acids (FISH OIL PO) Take 1 capsule by mouth daily.   Yes [provider]  oxyCODONE (ROXICODONE) 5 MG immediate release tablet Take 1 tablet (5 mg total) by  mouth every 6 (six) hours as needed for severe pain (pain score 7-10). 01/15/23  Yes Lonell Grandchild, MD  Polyethyl Glycol-Propyl Glycol (SYSTANE OP) Place 1 drop into both eyes daily as needed (dry eyes).   Yes [provider]  rosuvastatin (CRESTOR) 5 MG tablet Take 5 mg by mouth daily.   Yes [provider]                                                                                                                                    Past Surgical History Past Surgical History:  Procedure Laterality Date   BONE ANCHORED HEARING AID IMPLANT Left 2005   BUNIONECTOMY Bilateral    feet   CARPAL TUNNEL RELEASE Right    JOINT REPLACEMENT     KNEE ARTHROPLASTY Left 10/12/2015   SKIN CANCER EXCISION     "  tip of my nose; not melanoma"   TOTAL HIP ARTHROPLASTY  03/14/2011   Procedure: TOTAL HIP ARTHROPLASTY;  Surgeon: Loreta Ave, MD;  Location: [facility] OR;  Service: Orthopedics;  Laterality: Left;  120 MINUTES FOR THIS SURGERY   TOTAL HIP ARTHROPLASTY Right 09/02/2013   Procedure: TOTAL HIP ARTHROPLASTY ANTERIOR APPROACH: Disolacted trial head resection muscular abdominal inguinal ligament.;  Surgeon: Loreta Ave, MD;  Location: [facility] OR;  Service: Orthopedics;  Laterality: Right;   TOTAL KNEE ARTHROPLASTY Right 03/24/2014   Procedure: RIGHT TOTAL KNEE ARTHROPLASTY;  Surgeon: Loreta Ave, MD;  Location: [facility] OR;  Service: Orthopedics;  Laterality: Right;   TOTAL KNEE ARTHROPLASTY Left 10/12/2015   Procedure: TOTAL KNEE ARTHROPLASTY;  Surgeon: Loreta Ave, MD;  Location: [facility] OR;  Service: Orthopedics;  Laterality: Left;   TRANSFORAMINAL LUMBAR INTERBODY FUSION (TLIF) WITH PEDICLE SCREW FIXATION 1 LEVEL Left 01/25/2022   Procedure: LEFT-SIDED LUMBAR 4- LUMBAR 5 TRANSFORAMINAL LUMBAR  INTERBODY FUSION AND DECOMPRESSION WITH INSTRUMENTATION AND ALLOGRAFT;  Surgeon: Estill Bamberg, MD;  Location: [facility] OR;  Service: Orthopedics;  Laterality: Left;   TUBAL LIGATION     Family  History Family History  Adopted: Yes    Social History Social History   Tobacco Use   Smoking status: Never   Smokeless tobacco: Never  Vaping Use   Vaping status: Never Used  Substance Use Topics   Alcohol use: No   Drug use: No   Allergies Codeine  Review of Systems Review of Systems  All other systems reviewed and are negative.   Physical Exam Vital Signs  I have reviewed the triage vital signs BP 116/86   Pulse (!) 58   Temp 98 F (36.7 C)   Resp 18   Ht 5\' 2"  (1.575 m)   Wt 71.2 kg   SpO2 100%   BMI 28.72 kg/m  Physical Exam Vitals and nursing note reviewed.  Constitutional:      General: She is not in acute distress.    Appearance: Normal appearance. She is well-developed.  HENT:     Head: Normocephalic and atraumatic.     Mouth/Throat:     Mouth: Mucous membranes are moist.  Eyes:     Conjunctiva/sclera: Conjunctivae normal.     Pupils: Pupils are equal, round, and reactive to light.  Cardiovascular:     Rate and Rhythm: Normal rate and regular rhythm.     Pulses:          Radial pulses are 2+ on the right side and 2+ on the left side.     Heart sounds: No murmur heard. Pulmonary:     Effort: Pulmonary effort is normal. No respiratory distress.     Breath sounds: Normal breath sounds.  Abdominal:     General: Abdomen is flat.     Palpations: Abdomen is soft.     Tenderness: There is no abdominal tenderness.  Musculoskeletal:     Right lower leg: No edema.     Left lower leg: No edema.     Comments: No midline C, T, L-spine tenderness.  Full active range of motion of the left upper extremity throughout as well as the bilateral lower extremities with no focal tenderness or deformity.  Right upper extremity with tenderness around the proximal humerus, no clavicle tenderness, range of motion severely limited at the right shoulder.  Right elbow, wrist, hand atraumatic.  No wounds.  Skin:    General: Skin is warm and dry.  Capillary Refill:  Capillary refill takes less than 2 seconds.  Neurological:     General: No focal deficit present.     Mental Status: She is alert. Mental status is at baseline.     Comments: Neurovascularly intact in the right axillary, ulnar, median, and radial nerve distributions.  Psychiatric:        Mood and Affect: Mood normal.        Behavior: Behavior normal.     ED Results and Treatments Labs (all labs ordered are listed, but only abnormal results are displayed) Labs Reviewed - No data to display                                                                                                                        Radiology No results found.  Pertinent labs & imaging results that were available during my care of the patient were reviewed by me and considered in my medical decision making (see MDM for details).  Medications Ordered in ED Medications  HYDROmorphone (DILAUDID) injection 0.5 mg (0.5 mg Intravenous Given 01/15/23 2144)                                                                                                                                     Procedures Procedures  (including critical care time)  Medical Decision Making / ED Course   MDM:  77 year old female after trip and fall with right shoulder injury.  Exam with tenderness around the proximal right humerus.  X-ray demonstrates a humeral head fracture without dislocation.  No other signs of injury on physical examination.  No other humerus fracture. Neurovascularly intact. She denies any head injury.  Fall was mechanical and not due to syncopal event.  She is feeling better after pain control.  She came to the emergency department already with a sling and would like to keep the sling rather than having a new sling given.  She already follows up with orthopedic surgeon as an outpatient.  She will call them for follow-up appointment.  Prescribed oxycodone, recommended to use this after trying Tylenol as needed.   Discussed safe use of this medicine. Lives with family and thinks she can manage at home. Will discharge patient to home. All questions answered. Patient comfortable with plan of discharge. Return precautions discussed with patient and specified on the after visit summary.       Additional history obtained: -  Additional history obtained from family and ems    Imaging Studies ordered: I ordered imaging studies including XR shoulder On my interpretation imaging demonstrates humeral head fracture  I independently visualized and interpreted imaging.   Medicines ordered and prescription drug management: Meds ordered this encounter  Medications   HYDROmorphone (DILAUDID) injection 0.5 mg   HYDROmorphone (DILAUDID) 1 MG/ML injection    Lineberry, Haili N: cabinet override   oxyCODONE (ROXICODONE) 5 MG immediate release tablet    Sig: Take 1 tablet (5 mg total) by mouth every 6 (six) hours as needed for severe pain (pain score 7-10).    Dispense:  15 tablet    Refill:  0    -I have reviewed the patients home medicines and have made adjustments as needed   Reevaluation: After the interventions noted above, I reevaluated the patient and found that their symptoms have improved  Co morbidities that complicate the patient evaluation  Past Medical History:  Diagnosis Date   Cataract    left eye   Degenerative joint disease    Exposure to TB    "mother died of TB; I've never tested positive for it" (10/12/2015)   High cholesterol    History of blood transfusion    with childbirth   Osteoarthritis    PONV (postoperative nausea and vomiting)    Skin cancer    nose      Dispostion: Disposition decision including need for hospitalization was considered, and patient discharged from emergency department.    Final Clinical Impression(s) / ED Diagnoses Final diagnoses:  Closed fracture of head of right humerus, initial encounter     This chart was dictated using voice  recognition software.  Despite best efforts to proofread,  errors can occur which can change the documentation meaning.    Lonell Grandchild, MD 01/15/23 [phone]

## 2023-01-15 NOTE — Discharge Instructions (Addendum)
We evaluated you for your fall and shoulder injury.  Your x-ray shows a fracture to your humerus (upper arm).  Please keep your arm in a sling.  Please take at 1000 mg of Tylenol every 6 hours for pain.  If your pain is unrelieved by Tylenol you can take 5 mg of oxycodone every 6 hours.  Do not mix oxycodone with alcohol or drive while taking this medication.  Be very careful as of this medicine may make you drowsy and at high risk for falls.  You can try other therapies for your pain like over-the-counter lidocaine patches, heat therapy, or cold therapy.  Please schedule a follow-up appointment with your orthopedic surgeon with Lauren Montgomery.  Please return if you develop any new or worsening symptoms such as numbness or tingling, weakness, color change in your arm, severe pain, or you notice any other new injuries or pain elsewhere such as leg pain, headaches, chest pain, or any other new symptoms.

## 2023-01-15 NOTE — ED Triage Notes (Signed)
Pt arrived via EMS from home. Pt tripped on lumber in her garage and fell, bracing her fall with her R arm. Pt c/o R shoulder pain, EMS reports possible R shoulder deformity. Given fentanyl by EMS. Pt denies LOC, did not hit her head, and does not take blood thinners.

## 2023-01-15 NOTE — ED Notes (Signed)
Pt in NAD at d/c from ED. A&O. Ambulatory. Respirations even & unlabored. Skin warm & dry. Nausea improved. Pts sling provided by EMS adjusted for patient. Pt & family verbalized understanding of d/c teaching including follow up care, medications, pain management, sling care, and reasons to return to the ED. No needs or questions expressed at d/c.

## 2023-06-26 ENCOUNTER — Other Ambulatory Visit: Payer: Self-pay | Admitting: Family Medicine

## 2023-06-26 DIAGNOSIS — E2839 Other primary ovarian failure: Secondary | ICD-10-CM
